# Patient Record
Sex: Male | Born: 1945
Health system: Southern US, Community
[De-identification: ages and names within clinical notes are randomized; demographics above are authoritative.]

## PROBLEM LIST (undated history)

## (undated) DIAGNOSIS — R011 Cardiac murmur, unspecified: Secondary | ICD-10-CM

## (undated) DIAGNOSIS — Z8679 Personal history of other diseases of the circulatory system: Secondary | ICD-10-CM

## (undated) DIAGNOSIS — I10 Essential (primary) hypertension: Secondary | ICD-10-CM

## (undated) DIAGNOSIS — H3530 Unspecified macular degeneration: Secondary | ICD-10-CM

## (undated) DIAGNOSIS — G4730 Sleep apnea, unspecified: Secondary | ICD-10-CM

## (undated) DIAGNOSIS — J3800 Paralysis of vocal cords and larynx, unspecified: Secondary | ICD-10-CM

## (undated) DIAGNOSIS — C801 Malignant (primary) neoplasm, unspecified: Secondary | ICD-10-CM

## (undated) HISTORY — DX: Essential (primary) hypertension: I10

## (undated) HISTORY — PX: CHOLECYSTECTOMY: SHX55

## (undated) HISTORY — PX: HEMORRHOID SURGERY: SHX153

## (undated) HISTORY — DX: Paralysis of vocal cords and larynx, unspecified: J38.00

---

## 2014-08-01 DIAGNOSIS — J3800 Paralysis of vocal cords and larynx, unspecified: Secondary | ICD-10-CM | POA: Diagnosis not present

## 2014-08-01 DIAGNOSIS — R7309 Other abnormal glucose: Secondary | ICD-10-CM | POA: Diagnosis not present

## 2014-08-01 DIAGNOSIS — H3530 Unspecified macular degeneration: Secondary | ICD-10-CM | POA: Diagnosis not present

## 2014-08-01 DIAGNOSIS — I10 Essential (primary) hypertension: Secondary | ICD-10-CM | POA: Diagnosis not present

## 2014-08-01 DIAGNOSIS — H3500 Unspecified background retinopathy: Secondary | ICD-10-CM | POA: Diagnosis not present

## 2014-08-01 DIAGNOSIS — E669 Obesity, unspecified: Secondary | ICD-10-CM | POA: Diagnosis not present

## 2014-08-01 DIAGNOSIS — Z0000 Encounter for general adult medical examination without abnormal findings: Secondary | ICD-10-CM | POA: Diagnosis not present

## 2014-08-04 ENCOUNTER — Encounter: Payer: Self-pay | Admitting: Gastroenterology

## 2014-08-26 ENCOUNTER — Ambulatory Visit: Payer: Self-pay | Admitting: Gastroenterology

## 2014-09-03 ENCOUNTER — Ambulatory Visit (INDEPENDENT_AMBULATORY_CARE_PROVIDER_SITE_OTHER): Payer: Commercial Managed Care - HMO | Admitting: Gastroenterology

## 2014-09-03 ENCOUNTER — Encounter (INDEPENDENT_AMBULATORY_CARE_PROVIDER_SITE_OTHER): Payer: Self-pay

## 2014-09-03 ENCOUNTER — Encounter: Payer: Self-pay | Admitting: Gastroenterology

## 2014-09-03 VITALS — BP 130/80 | HR 78 | Temp 98.8°F | Ht 71.0 in | Wt 238.4 lb

## 2014-09-03 DIAGNOSIS — K642 Third degree hemorrhoids: Secondary | ICD-10-CM

## 2014-09-03 MED ORDER — HYDROCORTISONE 2.5 % RE CREA: 1.0000 "application " | TOPICAL_CREAM | Freq: Two times a day (BID) | RECTAL | Status: DC

## 2014-09-03 NOTE — Assessment & Plan Note (Signed)
Rectal bleeding due to hemorrhoids. Appear to be class III. Would like to review his colonoscopy report prior to scheduling for possible [facility] hemorrhoid banding. Further recommendations to follow.    Discussed colon cancer screening options for noninvasive testing with patient given his refusal of further colonoscopies due to his wife having perforated colon from procedure. He is not interested at this time and is currently up to date. He will let us know if he changes his mind in the future.

## 2014-09-03 NOTE — Progress Notes (Signed)
Primary Care Physician:  Rosita Fire, MD  Primary Gastroenterologist:  Barney Drain, MD   Chief Complaint  Patient presents with  . Hemorrhoids    HPI:  Roger Gould is a 69 y.o. male here for further management of hemorrhoids. Patient is a new patient, sent by Dr. Legrand Rams. He recently moved to the area from [place].Last colonoscopy about 18 months ago at [facility] to have follow up in five years for prior history of colon polyps and sister deceased secondary to colon cancer at age 68.  Patient states he has had his hemorrhoids lasered several times in the past. Has been couple of years. Worried about recurrent bleeding. Denies rectal pain or irritation. When he has a BM his hemorrhoids come out. He has to push them back in and then usually notices brb on the toilet tissue. Takes about 5-10 minutes for the bleeding to stop. Uses hemorrhoid suppository after BM about three times per week. Takes metamucil to keep stools soft and regular. Denies melena, abdominal pain, UGI symptoms.   States he will NEVER have another colonoscopy. His wife had a colonic perforation related to a routine colonoscopy. Care was delayed and required extensive surgery. He is not sure if he even has screening for colon cancer by any noninvasive means.   He is interested in definitive treatment of his hemorrhoids.    Current Outpatient Prescriptions  Medication Sig Dispense Refill  . lisinopril-hydrochlorothiazide (PRINZIDE,ZESTORETIC) 20-12.5 MG per tablet Take 1 tablet by mouth daily.     No current facility-administered medications for this visit.    Allergies as of 09/03/2014  . (No Known Allergies)    Past Medical History  Diagnosis Date  . Hypertension   . Paralysis of vocal cords     congenital    Past Surgical History  Procedure Laterality Date  . Cholecystectomy    . Hemorrhoid surgery      laser couple times    Family History  Problem Relation Age of Onset  . Colon  cancer Sister     age 100, deceased  . Emphysema Father     deceased age 62    History   Social History  . Marital Status: Unknown    Spouse Name: N/A  . Number of Children: 4  . Years of Education: N/A   Occupational History  . truck driver, retired    Social History Main Topics  . Smoking status: Former Smoker    Quit date: 09/03/2003  . Smokeless tobacco: Not on file     Comment: intermittent, socially  . Alcohol Use: No     Comment: occasional only  . Drug Use: No  . Sexual Activity: Not on file   Other Topics Concern  . Not on file   Social History Narrative  . No narrative on file      ROS:  General: Negative for anorexia, weight loss, fever, chills, fatigue, weakness. Eyes: Negative for vision changes.  ENT: Negative for hoarseness, difficulty swallowing , nasal congestion. CV: Negative for chest pain, angina, palpitations, dyspnea on exertion, peripheral edema.  Respiratory: Negative for dyspnea at rest, dyspnea on exertion, cough, sputum, wheezing.  GI: See history of present illness. GU:  Negative for dysuria, hematuria, urinary incontinence, urinary frequency, nocturnal urination.  MS: Negative for joint pain, low back pain.  Derm: Negative for rash or itching.  Neuro: Negative for weakness, abnormal sensation, seizure, frequent headaches, memory loss, confusion.  Psych: Negative for anxiety, depression, suicidal ideation, hallucinations.  Endo: Negative  for unusual weight change.  Heme: Negative for bruising or bleeding. Allergy: Negative for rash or hives.    Physical Examination:  BP 130/80 mmHg  Pulse 78  Temp(Src) 98.8 F (37.1 C)  Ht 5\' 11"  (1.803 m)  Wt 238 lb 6.4 oz (108.138 kg)  BMI 33.26 kg/m2   General: Well-nourished, well-developed in no acute distress.  Head: Normocephalic, atraumatic.   Eyes: Conjunctiva pink, no icterus. Mouth: Oropharyngeal mucosa moist and pink , no lesions erythema or exudate. Neck: Supple without  thyromegaly, masses, or lymphadenopathy.  Lungs: Clear to auscultation bilaterally.  Heart: Regular rate and rhythm, no murmurs rubs or gallops.  Abdomen: Bowel sounds are normal, nontender, nondistended, no hepatosplenomegaly or masses, no abdominal bruits or    hernia , no rebound or guarding.   Rectal: multiple hemorrhoids noted externally, one is pea sized with excoriation and bleeding noted. Easily reduced with gentle pressure. Significant hemorrhoid tissue noted within anal canal. No other abnormalities noted. No rectal tenderness. Extremities: No lower extremity edema. No clubbing or deformities.  Neuro: Alert and oriented x 4 , grossly normal neurologically.  Skin: Warm and dry, no rash or jaundice.   Psych: Alert and cooperative, normal mood and affect.    Imaging Studies: No results found.

## 2014-09-03 NOTE — Progress Notes (Signed)
cc'ed to pcp °

## 2014-09-03 NOTE — Patient Instructions (Signed)
1. I will request your records from [place] for review. Once these have been received, we will provide further recommendations. Your hemorrhoids may be too large to band. 2. Regarding colon cancer screening, since you wish to avoid invasive colonoscopy, you can consider other options. Virtual colonoscopy and Cologuard DNA testing for colon cancer are noninvasive options. If you decide at any point that you are interested in one of these options, please feel free to call our office.  You should not need anything until five years from time of your last colonoscopy.

## 2014-09-09 DIAGNOSIS — H5210 Myopia, unspecified eye: Secondary | ICD-10-CM | POA: Diagnosis not present

## 2014-09-09 DIAGNOSIS — H524 Presbyopia: Secondary | ICD-10-CM | POA: Diagnosis not present

## 2014-09-25 NOTE — Progress Notes (Signed)
Dr. Oneida Alar, please look over note. He is interested in [facility] banding, question candidacy. When she like me to schedule him for possible [facility] banding and let you determine at time of exam.

## 2014-09-25 NOTE — Progress Notes (Signed)
Received records from [place]. Patient had a colonoscopy February 2014, diverticulosis, mild, in the sigmoid colon. Internal hemorrhoids. Colonoscopy recommended in 5 years. Prep was excellent. The ileocecal valve was visualized, the cecum was not intubated due to colon redundancy and insufficient length of the scope.  Flexible sigmoidoscopy in 2007, pseudomembrane in the rectum and the sigmoid colon, internal and external hemorrhoids and anal canal.

## 2014-09-29 NOTE — Progress Notes (Signed)
REVIEWED.PT HAS PROLAPSED HEMORRHOIDS AND [facility] BANDING IS NOT THE BEST OPTION FOR HIM. THE BEST DEFINITIVE TREATMENT FOR HIS HEMORRHOIDS IS HEMORRHOIDECTOMY BY A GENERAL SURGEON. IF HE DOES NOT WANT SURGERY HE COULD HAVE A FLEX SIG WITH HEMORRHOID BANDING.

## 2014-10-02 NOTE — Progress Notes (Signed)
Please inform patient that Dr. Oneida Alar as reviewed his records and does not think he would be a good candidate for [place] banding given prolapsed hemorrhoids.   Best option for him is hemorrhoidectomy by general surgery or flex sig with hemorrhoid banding.   Let me know what he decides.

## 2014-10-03 ENCOUNTER — Other Ambulatory Visit: Payer: Self-pay

## 2014-10-03 DIAGNOSIS — K649 Unspecified hemorrhoids: Secondary | ICD-10-CM

## 2014-10-03 NOTE — Progress Notes (Signed)
Referral has been made.

## 2014-10-03 NOTE — Progress Notes (Signed)
Forwarding to Clinical pool.

## 2014-10-03 NOTE — Progress Notes (Signed)
PT was informed and he said he prefers to see a Psychologist, sport and exercise. Prefers [place], but will go to [facility] if necessary.

## 2014-10-03 NOTE — Progress Notes (Signed)
Dr. Aviva Signs in [facility] Surgery either option is fine.  Make referral for hemorrhoids.

## 2014-10-24 DIAGNOSIS — J3800 Paralysis of vocal cords and larynx, unspecified: Secondary | ICD-10-CM | POA: Diagnosis not present

## 2014-10-24 DIAGNOSIS — E669 Obesity, unspecified: Secondary | ICD-10-CM | POA: Diagnosis not present

## 2014-10-24 DIAGNOSIS — Z0001 Encounter for general adult medical examination with abnormal findings: Secondary | ICD-10-CM | POA: Diagnosis not present

## 2014-10-24 DIAGNOSIS — I10 Essential (primary) hypertension: Secondary | ICD-10-CM | POA: Diagnosis not present

## 2014-12-19 ENCOUNTER — Encounter: Payer: Self-pay | Admitting: Gastroenterology

## 2015-01-08 DIAGNOSIS — K641 Second degree hemorrhoids: Secondary | ICD-10-CM | POA: Diagnosis not present

## 2015-01-09 NOTE — H&P (Signed)
  NTS SOAP Note  Vital Signs:  Vitals as of: 0/12/3816: Systolic 299: Diastolic 90: Heart Rate 89: Temp 97.41F: Height 68ft 11in: Weight 226Lbs 0 Ounces: BMI 31.52  BMI : 31.52 kg/m2  Subjective: This 69 year old male presents for of hemorrhoidal disease.  Has intermittent episodes of hemorrhoidal bleeding and irritation.  Has had limited surgery in the past.  Creams have intremittently been helpful in the past.  Has had hemorrhoidal problems for many years.  Review of Symptoms:  Constitutional:unremarkable   Head:unremarkable Eyes:unremarkable   Nose/Mouth/Throat:unremarkable Cardiovascular:  unremarkable Respiratory:unremarkable Gastrointestinal:  unremarkable   Genitourinary:unremarkable   Musculoskeletal:unremarkable Skin:unremarkable Hematolgic/Lymphatic:unremarkable   Allergic/Immunologic:unremarkable   Past Medical History:  Reviewed  Past Medical History  Surgical History: cholecystectomy, hemorrhoidectomy Medical Problems: HTN Allergies: nkda Medications: prinzide   Social History:Reviewed  Social History  Preferred Language: English Race:  White Ethnicity: Not Hispanic / Latino Age: 69 year Marital Status:  M Alcohol: 2-4 beers a day   Smoking Status: Never smoker reviewed on 01/08/2015 Functional Status reviewed on 01/08/2015 ------------------------------------------------ Bathing: Normal Cooking: Normal Dressing: Normal Driving: Normal Eating: Normal Managing Meds: Normal Oral Care: Normal Shopping: Normal Toileting: Normal Transferring: Normal Walking: Normal Cognitive Status reviewed on 01/08/2015 ------------------------------------------------ Attention: Normal Decision Making: Normal Language: Normal Memory: Normal Motor: Normal Perception: Normal Problem Solving: Normal Visual and Spatial: Normal   Family History:Reviewed  Family Health History Family History is Unknown    Objective  Information: General:Well appearing, well nourished in no distress. Heart:RRR, no murmur Lungs:  CTA bilaterally, no wheezes, rhonchi, rales.  Breathing unlabored. prolapsing internal hemorrhoid with external hemorrhoid noted along right lateral aspect, smaller internal hemorrhoid noted along left lateral aspect  Assessment:Internal/external hemorrhoidal disease  Diagnoses: 455.6  K64.1 Hemorrhoids (Second degree hemorrhoids)  Procedures: 37169 - OFFICE OUTPATIENT NEW 30 MINUTES    Plan:  Scheduled for extensive hemorrhoidectomy on 01/19/15.   Patient Education:Alternative treatments to surgery were discussed with patient (and family).  Risks and benefits  of procedure incluidng bleeding, infection, and recurrence of the hemorrhoids were fully explained to the patient (and family) who gave informed consent. Patient/family questions were addressed.  Follow-up:Pending Surgery

## 2015-01-12 NOTE — Patient Instructions (Signed)
Roger Gould  01/12/2015     @PREFPERIOPPHARMACY @   Your procedure is scheduled on  01/19/2015   Report to [facility] at  720  A.M.  Call this number if you have problems the morning of surgery:  [phone]   Remember:  Do not eat food or drink liquids after midnight.  Take these medicines the morning of surgery with A SIP OF WATER  lisinopril   Do not wear jewelry, make-up or nail polish.  Do not wear lotions, powders, or perfumes.  You may wear deodorant.  Do not shave 48 hours prior to surgery.  Men may shave face and neck.  Do not bring valuables to the hospital.  [facility] is not responsible for any belongings or valuables.  Contacts, dentures or bridgework may not be worn into surgery.  Leave your suitcase in the car.  After surgery it may be brought to your room.  For patients admitted to the hospital, discharge time will be determined by your treatment team.  Patients discharged the day of surgery will not be allowed to drive home.   Name and phone number of your driver:   family Special instructions:  none  Please read over the following fact sheets that you were given. Pain Booklet, Coughing and Deep Breathing, Surgical Site Infection Prevention, Anesthesia Post-op Instructions and Care and Recovery After Surgery      Hemorrhoidectomy Hemorrhoidectomy is surgery to remove hemorrhoids. Hemorrhoids are veins that have become swollen in the rectum. The rectum is the area from the bottom end of the intestines to the opening where bowel movements leave the body. Hemorrhoids can be uncomfortable. They can cause itching, bleeding and pain if a blood clot forms in them (thrombose). If hemorrhoids are small, surgery may not be needed. But if they cover a larger area, surgery is usually suggested.  LET YOUR CAREGIVER KNOW ABOUT:   Any allergies.  All medications you are taking, including:  Herbs, eyedrops, over-the-counter medications and creams.  Blood  thinners (anticoagulants), aspirin or other drugs that could affect blood clotting.  Use of steroids (by mouth or as creams).  Previous problems with anesthetics, including local anesthetics.  Possibility of pregnancy, if this applies.  Any history of blood clots.  Any history of bleeding or other blood problems.  Previous surgery.  Smoking history.  Other health problems. RISKS AND COMPLICATIONS All surgery carries some risk. However, hemorrhoid surgery usually goes smoothly. Possible complications could include:  Urinary retention.  Bleeding.  Infection.  A painful incision.  A reaction to the anesthesia (this is not common). BEFORE THE PROCEDURE   Stop using aspirin and non-steroidal anti-inflammatory drugs (NSAIDs) for pain relief. This includes prescription drugs and over-the-counter drugs such as ibuprofen and naproxen. Also stop taking vitamin E. If possible, do this two weeks before your surgery.  If you take blood-thinners, ask your healthcare provider when you should stop taking them.  You will probably have blood and urine tests done several days before your surgery.  Do not eat or drink for about 8 hours before the surgery.  Arrive at least an hour before the surgery, or whenever your surgeon recommends. This will give you time to check in and fill out any needed paperwork.  Hemorrhoidectomy is often an outpatient procedure. This means you will be able to go home the same day. Sometimes, though, people stay overnight in the hospital after the procedure. Ask your surgeon what to expect. Either way, make  arrangements in advance for someone to drive you home. PROCEDURE   The preparation:  You will change into a hospital gown.  You will be given an IV. A needle will be inserted in your arm. Medication can flow directly into your body through this needle.  You might be given an enema to clear your rectum.  Once in the operating room, you will probably lie on  your side or be repositioned later to lying on your stomach.  You will be given anesthesia (medication) so you will not feel anything during the surgery. The surgery often is done with local anesthesia (the area near the hemorrhoids will be numb and you will be drowsy but awake). Sometimes, general anesthesia is used (you will be asleep during the procedure).  The procedure:  There are a few different procedures for hemorrhoids. Be sure to ask you surgeon about the procedure, the risks and benefits.  Be sure to ask about what you need to do to take care of the wound, if there is one. AFTER THE PROCEDURE  You will stay in a recovery area until the anesthesia has worn off. Your blood pressure and pulse will be checked every so often.  You may feel a lot of pain in the area of the rectum.  Take all pain medication prescribed by your surgeon. Ask before taking any over-the-counter pain medicines.  Sometimes sitting in a warm bath can help relieve your pain.  To make sure you have bowel movements without straining:  You will probably need to take stool softeners (usually a pill) for a few days.  You should drink 8 to 10 glasses of water each day.  Your activity will be restricted for awhile. Ask your caregiver for a list of what you should and should not do while you recover. Document Released: 01/23/2009 Document Revised: 06/20/2011 Document Reviewed: 01/23/2009 [facility] Patient Information 2015 [place], [place]. This information is not intended to replace advice given to you by your health care provider. Make sure you discuss any questions you have with your health care provider. PATIENT INSTRUCTIONS POST-ANESTHESIA  IMMEDIATELY FOLLOWING SURGERY:  Do not drive or operate machinery for the first twenty four hours after surgery.  Do not make any important decisions for twenty four hours after surgery or while taking narcotic pain medications or sedatives.  If you develop intractable nausea  and vomiting or a severe headache please notify your doctor immediately.  FOLLOW-UP:  Please make an appointment with your surgeon as instructed. You do not need to follow up with anesthesia unless specifically instructed to do so.  WOUND CARE INSTRUCTIONS (if applicable):  Keep a dry clean dressing on the anesthesia/puncture wound site if there is drainage.  Once the wound has quit draining you may leave it open to air.  Generally you should leave the bandage intact for twenty four hours unless there is drainage.  If the epidural site drains for more than 36-48 hours please call the anesthesia department.  QUESTIONS?:  Please feel free to call your physician or the hospital operator if you have any questions, and they will be happy to assist you.

## 2015-01-13 ENCOUNTER — Other Ambulatory Visit: Payer: Self-pay

## 2015-01-13 ENCOUNTER — Encounter (HOSPITAL_COMMUNITY)
Admission: RE | Admit: 2015-01-13 | Discharge: 2015-01-13 | Disposition: A | Discharge status: Home / Self Care | Payer: Commercial Managed Care - HMO | Source: Ambulatory Visit | Attending: General Surgery | Admitting: General Surgery

## 2015-01-13 ENCOUNTER — Encounter (HOSPITAL_COMMUNITY): Payer: Self-pay

## 2015-01-13 DIAGNOSIS — K644 Residual hemorrhoidal skin tags: Secondary | ICD-10-CM | POA: Diagnosis not present

## 2015-01-13 DIAGNOSIS — H2512 Age-related nuclear cataract, left eye: Secondary | ICD-10-CM | POA: Diagnosis not present

## 2015-01-13 DIAGNOSIS — H2511 Age-related nuclear cataract, right eye: Secondary | ICD-10-CM | POA: Diagnosis not present

## 2015-01-13 DIAGNOSIS — K648 Other hemorrhoids: Secondary | ICD-10-CM | POA: Insufficient documentation

## 2015-01-13 DIAGNOSIS — Z01818 Encounter for other preprocedural examination: Secondary | ICD-10-CM | POA: Insufficient documentation

## 2015-01-13 DIAGNOSIS — H353131 Nonexudative age-related macular degeneration, bilateral, early dry stage: Secondary | ICD-10-CM | POA: Diagnosis not present

## 2015-01-13 HISTORY — DX: Personal history of other diseases of the circulatory system: Z86.79

## 2015-01-13 HISTORY — DX: Unspecified macular degeneration: H35.30

## 2015-01-13 HISTORY — DX: Malignant (primary) neoplasm, unspecified: C80.1

## 2015-01-13 HISTORY — DX: Sleep apnea, unspecified: G47.30

## 2015-01-13 LAB — CBC WITH DIFFERENTIAL/PLATELET
BASOS ABS: 0.1 10*3/uL (ref 0.0–0.1)
BASOS PCT: 1 %
EOS ABS: 0.2 10*3/uL (ref 0.0–0.7)
Eosinophils Relative: 2 %
HEMATOCRIT: 43.1 % (ref 39.0–52.0)
HEMOGLOBIN: 15.3 g/dL (ref 13.0–17.0)
Lymphocytes Relative: 30 %
Lymphs Abs: 2.5 10*3/uL (ref 0.7–4.0)
MCH: 32.1 pg (ref 26.0–34.0)
MCHC: 35.5 g/dL (ref 30.0–36.0)
MCV: 90.4 fL (ref 78.0–100.0)
Monocytes Absolute: 0.7 10*3/uL (ref 0.1–1.0)
Monocytes Relative: 8 %
NEUTROS ABS: 4.9 10*3/uL (ref 1.7–7.7)
NEUTROS PCT: 59 %
Platelets: 209 10*3/uL (ref 150–400)
RBC: 4.77 MIL/uL (ref 4.22–5.81)
RDW: 12.8 % (ref 11.5–15.5)
WBC: 8.3 10*3/uL (ref 4.0–10.5)

## 2015-01-13 LAB — BASIC METABOLIC PANEL
ANION GAP: 9 (ref 5–15)
BUN: 23 mg/dL — ABNORMAL HIGH (ref 6–20)
CALCIUM: 9 mg/dL (ref 8.9–10.3)
CO2: 26 mmol/L (ref 22–32)
CREATININE: 1.12 mg/dL (ref 0.61–1.24)
Chloride: 104 mmol/L (ref 101–111)
Glucose, Bld: 93 mg/dL (ref 65–99)
Potassium: 3.9 mmol/L (ref 3.5–5.1)
SODIUM: 139 mmol/L (ref 135–145)

## 2015-01-13 NOTE — Pre-Procedure Instructions (Signed)
Patient given inforfmation to sign up for my chart at home.

## 2015-01-14 ENCOUNTER — Ambulatory Visit: Payer: Commercial Managed Care - HMO | Admitting: Gastroenterology

## 2015-01-19 ENCOUNTER — Encounter (HOSPITAL_COMMUNITY)
Admission: RE | Disposition: A | Discharge status: Home / Self Care | Payer: Self-pay | Source: Ambulatory Visit | Attending: General Surgery

## 2015-01-19 ENCOUNTER — Ambulatory Visit (HOSPITAL_COMMUNITY): Payer: Commercial Managed Care - HMO | Admitting: Anesthesiology

## 2015-01-19 ENCOUNTER — Ambulatory Visit (HOSPITAL_COMMUNITY)
Admission: RE | Admit: 2015-01-19 | Discharge: 2015-01-19 | Disposition: A | Discharge status: Home / Self Care | Payer: Commercial Managed Care - HMO | Source: Ambulatory Visit | Attending: General Surgery | Admitting: General Surgery

## 2015-01-19 DIAGNOSIS — G4730 Sleep apnea, unspecified: Secondary | ICD-10-CM | POA: Insufficient documentation

## 2015-01-19 DIAGNOSIS — I10 Essential (primary) hypertension: Secondary | ICD-10-CM | POA: Diagnosis not present

## 2015-01-19 DIAGNOSIS — Z87891 Personal history of nicotine dependence: Secondary | ICD-10-CM | POA: Insufficient documentation

## 2015-01-19 DIAGNOSIS — K648 Other hemorrhoids: Secondary | ICD-10-CM | POA: Diagnosis not present

## 2015-01-19 DIAGNOSIS — K644 Residual hemorrhoidal skin tags: Secondary | ICD-10-CM | POA: Insufficient documentation

## 2015-01-19 DIAGNOSIS — K649 Unspecified hemorrhoids: Secondary | ICD-10-CM | POA: Diagnosis present

## 2015-01-19 DIAGNOSIS — K641 Second degree hemorrhoids: Secondary | ICD-10-CM | POA: Diagnosis not present

## 2015-01-19 HISTORY — PX: HEMORRHOID SURGERY: SHX153

## 2015-01-19 SURGERY — HEMORRHOIDECTOMY
Anesthesia: General | Site: Rectum

## 2015-01-19 MED ORDER — KETOROLAC TROMETHAMINE 30 MG/ML IJ SOLN
30.0000 mg | Freq: Once | INTRAMUSCULAR | Status: AC
  Administered 2015-01-19: 30 mg via INTRAVENOUS

## 2015-01-19 MED ORDER — FENTANYL CITRATE (PF) 100 MCG/2ML IJ SOLN: 25.0000 ug | INTRAMUSCULAR | Status: DC | PRN

## 2015-01-19 MED ORDER — LACTATED RINGERS IV SOLN
INTRAVENOUS | Status: DC
  Administered 2015-01-19: 09:00:00 via INTRAVENOUS

## 2015-01-19 MED ORDER — HYDROCODONE-ACETAMINOPHEN 5-325 MG PO TABS: 1.0000 | ORAL_TABLET | Freq: Four times a day (QID) | ORAL | Status: AC | PRN

## 2015-01-19 MED ORDER — MIDAZOLAM HCL 2 MG/2ML IJ SOLN
1.0000 mg | INTRAMUSCULAR | Status: DC | PRN
Start: 2015-01-19 — End: 2015-01-19
  Administered 2015-01-19: 2 mg via INTRAVENOUS

## 2015-01-19 MED ORDER — SODIUM CHLORIDE 0.9 % IR SOLN
Status: DC | PRN
Start: 2015-01-19 — End: 2015-01-19
  Administered 2015-01-19: 500 mL

## 2015-01-19 MED ORDER — BUPIVACAINE HCL (PF) 0.5 % IJ SOLN
INTRAMUSCULAR | Status: AC
  Filled 2015-01-19: qty 30

## 2015-01-19 MED ORDER — PROPOFOL 10 MG/ML IV BOLUS
INTRAVENOUS | Status: AC
  Filled 2015-01-19: qty 20

## 2015-01-19 MED ORDER — FENTANYL CITRATE (PF) 100 MCG/2ML IJ SOLN
INTRAMUSCULAR | Status: DC | PRN
  Administered 2015-01-19 (×2): 50 ug via INTRAVENOUS

## 2015-01-19 MED ORDER — CHLORHEXIDINE GLUCONATE 4 % EX LIQD: 1.0000 "application " | Freq: Once | CUTANEOUS | Status: DC

## 2015-01-19 MED ORDER — BUPIVACAINE HCL (PF) 0.5 % IJ SOLN
INTRAMUSCULAR | Status: DC | PRN
  Administered 2015-01-19: 9 mL

## 2015-01-19 MED ORDER — FENTANYL CITRATE (PF) 100 MCG/2ML IJ SOLN
25.0000 ug | INTRAMUSCULAR | Status: AC
  Administered 2015-01-19: 25 ug via INTRAVENOUS

## 2015-01-19 MED ORDER — ONDANSETRON HCL 4 MG/2ML IJ SOLN
4.0000 mg | Freq: Once | INTRAMUSCULAR | Status: AC
  Administered 2015-01-19: 4 mg via INTRAVENOUS

## 2015-01-19 MED ORDER — PROPOFOL 10 MG/ML IV BOLUS
INTRAVENOUS | Status: DC | PRN
Start: 2015-01-19 — End: 2015-01-19
  Administered 2015-01-19: 20 mg via INTRAVENOUS
  Administered 2015-01-19: 150 mg via INTRAVENOUS
  Administered 2015-01-19: 30 mg via INTRAVENOUS

## 2015-01-19 MED ORDER — LIDOCAINE VISCOUS 2 % MT SOLN
OROMUCOSAL | Status: AC
  Filled 2015-01-19: qty 15

## 2015-01-19 MED ORDER — METRONIDAZOLE IN NACL 5-0.79 MG/ML-% IV SOLN
500.0000 mg | INTRAVENOUS | Status: AC
  Administered 2015-01-19: 500 mg via INTRAVENOUS

## 2015-01-19 MED ORDER — KETOROLAC TROMETHAMINE 30 MG/ML IJ SOLN
INTRAMUSCULAR | Status: AC
  Filled 2015-01-19: qty 1

## 2015-01-19 MED ORDER — LIDOCAINE HCL (CARDIAC) 10 MG/ML IV SOLN
INTRAVENOUS | Status: DC | PRN
  Administered 2015-01-19: 50 mg via INTRAVENOUS

## 2015-01-19 MED ORDER — MIDAZOLAM HCL 2 MG/2ML IJ SOLN
INTRAMUSCULAR | Status: AC
  Filled 2015-01-19: qty 2

## 2015-01-19 MED ORDER — ONDANSETRON HCL 4 MG/2ML IJ SOLN
INTRAMUSCULAR | Status: AC
  Filled 2015-01-19: qty 2

## 2015-01-19 MED ORDER — METRONIDAZOLE IN NACL 5-0.79 MG/ML-% IV SOLN
INTRAVENOUS | Status: AC
  Filled 2015-01-19: qty 100

## 2015-01-19 MED ORDER — FENTANYL CITRATE (PF) 100 MCG/2ML IJ SOLN
INTRAMUSCULAR | Status: AC
  Filled 2015-01-19: qty 2

## 2015-01-19 MED ORDER — LIDOCAINE VISCOUS 2 % MT SOLN
OROMUCOSAL | Status: DC | PRN
  Administered 2015-01-19: 1 via OROMUCOSAL

## 2015-01-19 MED ORDER — ONDANSETRON HCL 4 MG/2ML IJ SOLN: 4.0000 mg | Freq: Once | INTRAMUSCULAR | Status: DC | PRN

## 2015-01-19 MED ORDER — FENTANYL CITRATE (PF) 100 MCG/2ML IJ SOLN
INTRAMUSCULAR | Status: AC
  Filled 2015-01-19: qty 4

## 2015-01-19 SURGICAL SUPPLY — 28 items
BAG HAMPER (MISCELLANEOUS) ×3 IMPLANT
CLOTH BEACON ORANGE TIMEOUT ST (SAFETY) ×3 IMPLANT
COVER LIGHT HANDLE STERIS (MISCELLANEOUS) ×6 IMPLANT
COVER MAYO STAND XLG (DRAPE) ×3 IMPLANT
DECANTER SPIKE VIAL GLASS SM (MISCELLANEOUS) ×3 IMPLANT
DRAPE PROXIMA HALF (DRAPES) ×3 IMPLANT
ELECT REM PT RETURN 9FT ADLT (ELECTROSURGICAL) ×3
ELECTRODE REM PT RTRN 9FT ADLT (ELECTROSURGICAL) ×1 IMPLANT
FORMALIN 10 PREFIL 120ML (MISCELLANEOUS) ×3 IMPLANT
GAUZE SPONGE 4X4 12PLY STRL (GAUZE/BANDAGES/DRESSINGS) ×3 IMPLANT
GLOVE SURG SS PI 7.5 STRL IVOR (GLOVE) ×6 IMPLANT
GOWN STRL REUS W/ TWL XL LVL3 (GOWN DISPOSABLE) ×1 IMPLANT
GOWN STRL REUS W/TWL LRG LVL3 (GOWN DISPOSABLE) ×3 IMPLANT
GOWN STRL REUS W/TWL XL LVL3 (GOWN DISPOSABLE) ×2
HEMOSTAT SURGICEL 4X8 (HEMOSTASIS) ×3 IMPLANT
KIT ROOM TURNOVER AP CYSTO (KITS) ×3 IMPLANT
LIGASURE IMPACT 36 18CM CVD LR (INSTRUMENTS) ×3 IMPLANT
MANIFOLD NEPTUNE II (INSTRUMENTS) ×3 IMPLANT
NEEDLE HYPO 25X1 1.5 SAFETY (NEEDLE) ×3 IMPLANT
NS IRRIG 1000ML POUR BTL (IV SOLUTION) ×3 IMPLANT
PACK PERI GYN (CUSTOM PROCEDURE TRAY) ×3 IMPLANT
PAD ARMBOARD 7.5X6 YLW CONV (MISCELLANEOUS) ×3 IMPLANT
SET BASIN LINEN APH (SET/KITS/TRAYS/PACK) ×3 IMPLANT
SPONGE GAUZE 4X4 12PLY (GAUZE/BANDAGES/DRESSINGS) ×2 IMPLANT
SURGILUBE 3G PEEL PACK STRL (MISCELLANEOUS) ×3 IMPLANT
SUT SILK 0 FSL (SUTURE) ×3 IMPLANT
SUT VIC AB 2-0 CT2 27 (SUTURE) IMPLANT
SYRINGE 10CC LL (SYRINGE) ×3 IMPLANT

## 2015-01-19 NOTE — Transfer of Care (Signed)
Immediate Anesthesia Transfer of Care Note  Patient: Roger Gould  Procedure(s) Performed: Procedure(s): EXTENSIVE HEMORRHOIDECTOMY (N/A)  Patient Location: PACU  Anesthesia Type:General  Level of Consciousness: sedated and patient cooperative  Airway & Oxygen Therapy: Patient Spontanous Breathing and non-rebreather face mask  Post-op Assessment: Report given to RN, Post -op Vital signs reviewed and stable and Patient moving all extremities  Post vital signs: Reviewed and stable    Complications: No apparent anesthesia complications

## 2015-01-19 NOTE — Interval H&P Note (Signed)
History and Physical Interval Note:  01/19/2015 8:29 AM  Roger Gould  has presented today for surgery, with the diagnosis of internal and external hemorrhoids  The various methods of treatment have been discussed with the patient and family. After consideration of risks, benefits and other options for treatment, the patient has consented to  Procedure(s): EXTENSIVE HEMORRHOIDECTOMY (N/A) as a surgical intervention .  The patient's history has been reviewed, patient examined, no change in status, stable for surgery.  I have reviewed the patient's chart and labs.  Questions were answered to the patient's satisfaction.     Aviva Signs A

## 2015-01-19 NOTE — Op Note (Signed)
Patient:  Roger Gould  DOB:  1945/06/04  MRN:  [id number]   Preop Diagnosis:  Bleeding hemorrhoidal disease  Postop Diagnosis:  Same, prolapse  Procedure:  Extensive hemorrhoidectomy  Surgeon:  Aviva Signs, M.D.  Anes:  Gen.  Indications:  Patient is a 69 year old white male who presents with bleeding hemorrhoidal disease. The risks and benefits of the procedure including bleeding, infection, and the possibility of recurrence of hemorrhoidal disease were fully explained to the patient, who gave informed consent.  Procedure note:  The patient was placed in the lithotomy position after general anesthesia was administered. The perineum was prepped and draped using the usual sterile technique with Betadine. Surgical site confirmation was performed.  On rectal examination, the patient had internal and external hemorrhoidal disease at the 7:00 position along with prolapsing of the internal hemorrhoid and mucosa. The external and internal hemorrhoids were removed in continuity in this region using the LigaSure in a Mar-Mac like manner. Care was taken to avoid the external sphincter mechanism. The mucosa was also reaproximated with interrupted 2-0 Vicryl sutures. No significant other hemorrhoids were found. 0.5% Sensorcaine was instilled the surrounding perineum. Surgicel and Viscous Xylocaine rectal packing was then placed.  All tape and needle counts were correct at the end of the procedure. The patient was awakened and transferred to PACU in stable condition.  Complications:  None  EBL:  Minimal  Specimen:  Hemorrhoids

## 2015-01-19 NOTE — Discharge Instructions (Signed)
Surgical Procedures for Hemorrhoids, Care After Refer to this sheet in the next few weeks. These instructions provide you with information about caring for yourself after your procedure. Your health care provider may also give you more specific instructions. Your treatment has been planned according to current medical practices, but problems sometimes occur. Call your health care provider if you have any problems or questions after your procedure. WHAT TO EXPECT AFTER THE PROCEDURE After your procedure, it is common to have:  Rectal pain.  Pain when you are having a bowel movement.  Slight rectal bleeding. HOME CARE INSTRUCTIONS Medicines  Take over-the-counter and prescription medicines only as told by your health care provider.  Do not drive or operate heavy machinery while taking prescription pain medicine.  Use a stool softener or a bulk laxative as told by your health care provider. Activities  Rest at home. Return to your normal activities as told by your health care provider.  Do not lift anything that is heavier than 10 lb (4.5 kg).  Do not sit for long periods of time. Take a walk every day or as told by your health care provider.  Do not strain to have a bowel movement. Do not spend a long time sitting on the toilet. Eating and Drinking  Eat foods that contain fiber, such as whole grains, beans, nuts, fruits, and vegetables.  Drink enough fluid to keep your urine clear or pale yellow. General Instructions  Sit in a warm bath 2-3 times per day to relieve soreness or itching.  Keep all follow-up visits as told by your health care provider. This is important. SEEK MEDICAL CARE IF:  Your pain medicine is not helping.  You have a fever or chills.  You become constipated.  You have trouble passing urine. SEEK IMMEDIATE MEDICAL CARE IF:  You have very bad rectal pain.  You have heavy bleeding from your rectum.   This information is not intended to replace advice  given to you by your health care provider. Make sure you discuss any questions you have with your health care provider.   Document Released: 06/18/2003 Document Revised: 12/17/2014 Document Reviewed: 06/23/2014 Elsevier Interactive Patient Education 2016 [employer]  PATIENT INSTRUCTIONS POST-ANESTHESIA  IMMEDIATELY FOLLOWING SURGERY:  Do not drive or operate machinery for the first twenty four hours after surgery.  Do not make any important decisions for twenty four hours after surgery or while taking narcotic pain medications or sedatives.  If you develop intractable nausea and vomiting or a severe headache please notify your doctor immediately.  FOLLOW-UP:  Please make an appointment with your surgeon as instructed. You do not need to follow up with anesthesia unless specifically instructed to do so.  WOUND CARE INSTRUCTIONS (if applicable):  Keep a dry clean dressing on the anesthesia/puncture wound site if there is drainage.  Once the wound has quit draining you may leave it open to air.  Generally you should leave the bandage intact for twenty four hours unless there is drainage.  If the epidural site drains for more than 36-48 hours please call the anesthesia department.  QUESTIONS?:  Please feel free to call your physician or the hospital operator if you have any questions, and they will be happy to assist you.

## 2015-01-19 NOTE — Anesthesia Postprocedure Evaluation (Signed)
  Anesthesia Post-op Note Late entry:Patient: Roger Gould  Procedure(s) Performed: Procedure(s): EXTENSIVE HEMORRHOIDECTOMY (N/A)  Patient Location: PACU  Anesthesia Type:General  Level of Consciousness: awake, alert , oriented and patient cooperative  Airway and Oxygen Therapy: Patient Spontanous Breathing  Post-op Pain: mild  Post-op Assessment: Post-op Vital signs reviewed, Patient's Cardiovascular Status Stable, Respiratory Function Stable, Patent Airway, No signs of Nausea or vomiting and Pain level controlled              Post-op Vital Signs: Reviewed and stable  Last Vitals:  Filed Vitals:   01/19/15 1044  BP: 123/71  Pulse: 58  Temp:   Resp: 16    Complications: No apparent anesthesia complications

## 2015-01-19 NOTE — Anesthesia Preprocedure Evaluation (Signed)
Anesthesia Evaluation  Patient identified by MRN, date of birth, ID band Patient awake    Reviewed: Allergy & Precautions, NPO status , Patient's Chart, lab work & pertinent test results  Airway Mallampati: I  TM Distance: >3 FB    Comment: Raspy voice from paralysed VC Dental  (+) Edentulous Upper, Edentulous Lower   Pulmonary sleep apnea and Continuous Positive Airway Pressure Ventilation , former smoker,    breath sounds clear to auscultation       Cardiovascular hypertension, Pt. on medications  Rhythm:Regular Rate:Normal     Neuro/Psych    GI/Hepatic negative GI ROS,   Endo/Other    Renal/GU      Musculoskeletal   Abdominal   Peds  Hematology   Anesthesia Other Findings   Reproductive/Obstetrics                             Anesthesia Physical Anesthesia Plan  ASA: II  Anesthesia Plan: General   Post-op Pain Management:    Induction: Intravenous  Airway Management Planned: LMA  Additional Equipment:   Intra-op Plan:   Post-operative Plan: Extubation in OR  Informed Consent: I have reviewed the patients History and Physical, chart, labs and discussed the procedure including the risks, benefits and alternatives for the proposed anesthesia with the patient or authorized representative who has indicated his/her understanding and acceptance.     Plan Discussed with:   Anesthesia Plan Comments:         Anesthesia Quick Evaluation

## 2015-01-19 NOTE — Anesthesia Procedure Notes (Signed)
Procedure Name: LMA Insertion Date/Time: 01/19/2015 9:14 AM Performed by: Vista Deck Pre-anesthesia Checklist: Patient identified, Patient being monitored, Emergency Drugs available, Timeout performed and Suction available Patient Re-evaluated:Patient Re-evaluated prior to inductionOxygen Delivery Method: Circle System Utilized Preoxygenation: Pre-oxygenation with 100% oxygen Intubation Type: IV induction Ventilation: Mask ventilation without difficulty LMA: LMA inserted LMA Size: 4.0 Number of attempts: 1 Placement Confirmation: positive ETCO2 and breath sounds checked- equal and bilateral Tube secured with: Tape

## 2015-01-20 ENCOUNTER — Encounter (HOSPITAL_COMMUNITY): Payer: Self-pay | Admitting: General Surgery

## 2015-01-23 DIAGNOSIS — J3800 Paralysis of vocal cords and larynx, unspecified: Secondary | ICD-10-CM | POA: Diagnosis not present

## 2015-01-23 DIAGNOSIS — I10 Essential (primary) hypertension: Secondary | ICD-10-CM | POA: Diagnosis not present

## 2015-01-23 DIAGNOSIS — E669 Obesity, unspecified: Secondary | ICD-10-CM | POA: Diagnosis not present

## 2015-01-23 DIAGNOSIS — Z23 Encounter for immunization: Secondary | ICD-10-CM | POA: Diagnosis not present

## 2015-01-23 DIAGNOSIS — K649 Unspecified hemorrhoids: Secondary | ICD-10-CM | POA: Diagnosis not present

## 2015-02-16 DIAGNOSIS — L570 Actinic keratosis: Secondary | ICD-10-CM | POA: Diagnosis not present

## 2015-02-16 DIAGNOSIS — D239 Other benign neoplasm of skin, unspecified: Secondary | ICD-10-CM | POA: Diagnosis not present

## 2015-02-24 ENCOUNTER — Encounter (HOSPITAL_COMMUNITY): Payer: Self-pay | Admitting: Emergency Medicine

## 2015-02-24 ENCOUNTER — Observation Stay (HOSPITAL_COMMUNITY)
Admission: EM | Admit: 2015-02-24 | Discharge: 2015-02-25 | Disposition: A | Discharge status: Home / Self Care | Payer: Commercial Managed Care - HMO | Attending: Internal Medicine | Admitting: Internal Medicine

## 2015-02-24 ENCOUNTER — Emergency Department (HOSPITAL_COMMUNITY): Payer: Commercial Managed Care - HMO

## 2015-02-24 DIAGNOSIS — R072 Precordial pain: Secondary | ICD-10-CM | POA: Diagnosis not present

## 2015-02-24 DIAGNOSIS — J3800 Paralysis of vocal cords and larynx, unspecified: Secondary | ICD-10-CM | POA: Diagnosis not present

## 2015-02-24 DIAGNOSIS — G4730 Sleep apnea, unspecified: Secondary | ICD-10-CM | POA: Diagnosis not present

## 2015-02-24 DIAGNOSIS — Z85828 Personal history of other malignant neoplasm of skin: Secondary | ICD-10-CM | POA: Insufficient documentation

## 2015-02-24 DIAGNOSIS — Z79899 Other long term (current) drug therapy: Secondary | ICD-10-CM | POA: Insufficient documentation

## 2015-02-24 DIAGNOSIS — R42 Dizziness and giddiness: Secondary | ICD-10-CM | POA: Diagnosis not present

## 2015-02-24 DIAGNOSIS — Z87891 Personal history of nicotine dependence: Secondary | ICD-10-CM | POA: Insufficient documentation

## 2015-02-24 DIAGNOSIS — R0789 Other chest pain: Secondary | ICD-10-CM | POA: Diagnosis not present

## 2015-02-24 DIAGNOSIS — R079 Chest pain, unspecified: Principal | ICD-10-CM | POA: Diagnosis present

## 2015-02-24 DIAGNOSIS — I10 Essential (primary) hypertension: Secondary | ICD-10-CM | POA: Diagnosis not present

## 2015-02-24 DIAGNOSIS — H3530 Unspecified macular degeneration: Secondary | ICD-10-CM | POA: Diagnosis not present

## 2015-02-24 LAB — CBC
HCT: 42.2 % (ref 39.0–52.0)
HEMOGLOBIN: 14.6 g/dL (ref 13.0–17.0)
MCH: 30.9 pg (ref 26.0–34.0)
MCHC: 34.6 g/dL (ref 30.0–36.0)
MCV: 89.4 fL (ref 78.0–100.0)
PLATELETS: 180 10*3/uL (ref 150–400)
RBC: 4.72 MIL/uL (ref 4.22–5.81)
RDW: 12.9 % (ref 11.5–15.5)
WBC: 9.2 10*3/uL (ref 4.0–10.5)

## 2015-02-24 LAB — BASIC METABOLIC PANEL
ANION GAP: 10 (ref 5–15)
BUN: 27 mg/dL — ABNORMAL HIGH (ref 6–20)
CALCIUM: 9.1 mg/dL (ref 8.9–10.3)
CO2: 23 mmol/L (ref 22–32)
CREATININE: 0.99 mg/dL (ref 0.61–1.24)
Chloride: 106 mmol/L (ref 101–111)
Glucose, Bld: 129 mg/dL — ABNORMAL HIGH (ref 65–99)
Potassium: 3.4 mmol/L — ABNORMAL LOW (ref 3.5–5.1)
SODIUM: 139 mmol/L (ref 135–145)

## 2015-02-24 LAB — TROPONIN I

## 2015-02-24 NOTE — ED Notes (Addendum)
Pt c/o sudden onset of chest pain with diaphoresis. Pt denies chest pain at this time. Pt took 4 baby aspirin at home before ems arrived.

## 2015-02-24 NOTE — H&P (Signed)
[facility] History and Physical  Hallet Obenour [id number] DOB: 1945/07/05    PCP:   Rosita Fire, MD   Chief Complaint:  Retrosternal chest pressure, lightheadedness and diaphoresis for 15 minutes.   HPI: Roger Gould is an 69 y.o. male with hx of HTN, Vocal chord paralysis, sleep apnea but no longer use CPAP after weightloss, with no known hx of CAD or prior MI, presented to the ER with about 15 minutes of retrosternal chest pressure, some lighheadedness, and diaphoresis.  He was at rest when it happened.  He is otherwise feeling well, and he did not have any exertional chest pain or chest pressure with exertion. There is no pleuritic pain, fever, chills, or coughs.  Evaluation in the ER included an EKG which showed Q waves in III and AVF, no acute ST segment changes.  His CXR was clear and his troponin was negative.  When he was in the ED, he denied any symptoms.  Hospitalist was asked to admit him for cardiac r/out.   Rewiew of Systems:  Constitutional: Negative for malaise, fever and chills. No significant weight loss or weight gain Eyes: Negative for eye pain, redness and discharge, diplopia, visual changes, or flashes of light. ENMT: Negative for ear pain, hoarseness, nasal congestion, sinus pressure and sore throat. No headaches; tinnitus, drooling, or problem swallowing. Cardiovascular: Negative for dyspnea and peripheral edema. ; No orthopnea, PND Respiratory: Negative for cough, hemoptysis, wheezing and stridor. No pleuritic chestpain. Gastrointestinal: Negative for nausea, vomiting, diarrhea, constipation, abdominal pain, melena, blood in stool, hematemesis, jaundice and rectal bleeding.    Genitourinary: Negative for frequency, dysuria, incontinence,flank pain and hematuria; Musculoskeletal: Negative for back pain and neck pain. Negative for swelling and trauma.;  Skin: . Negative for pruritus, rash, abrasions, bruising and skin lesion.; ulcerations Neuro: Negative  for headache, lightheadedness and neck stiffness. Negative for weakness, altered level of consciousness , altered mental status, extremity weakness, burning feet, involuntary movement, seizure and syncope.  Psych: negative for anxiety, depression, insomnia, tearfulness, panic attacks, hallucinations, paranoia, suicidal or homicidal ideation    Past Medical History  Diagnosis Date  . Hypertension   . Paralysis of vocal cords     congenital  . Sleep apnea     has CPAP but doesnt use it. Pt has lost weight and does not need.  . Cancer ([place])     basal cell; skin cancer.  Marland Kitchen History of rheumatic fever     age 49  . Macular degeneration     early stages    Past Surgical History  Procedure Laterality Date  . Cholecystectomy    . Hemorrhoid surgery      laser couple times  . Hemorrhoid surgery N/A 01/19/2015    Procedure: EXTENSIVE HEMORRHOIDECTOMY;  Surgeon: Aviva Signs, MD;  Location: [facility];  Service: General;  Laterality: N/A;    Medications:  HOME MEDS: Prior to Admission medications   Medication Sig Start Date End Date Taking? Authorizing Provider  BIOTIN PO Take 1 tablet by mouth daily.   Yes Historical Provider, MD  fluocinonide (LIDEX) 0.05 % external solution Apply 1 application topically daily. Applied to scalp   Yes Historical Provider, MD  lisinopril-hydrochlorothiazide (PRINZIDE,ZESTORETIC) 20-12.5 MG per tablet Take 1 tablet by mouth daily.   Yes Historical Provider, MD  Multiple Vitamins-Minerals (MULTIVITAMIN WITH MINERALS) tablet Take 1 tablet by mouth daily.   Yes Historical Provider, MD  Multiple Vitamins-Minerals (PRESERVISION AREDS PO) Take 1 tablet by mouth daily.   Yes Historical Provider, MD  Omega-3 Fatty Acids (FISH OIL) 1000 MG CAPS Take 1,000 mg by mouth daily.   Yes Historical Provider, MD  HYDROcodone-acetaminophen (NORCO/VICODIN) 5-325 MG tablet Take 1-2 tablets by mouth every 6 (six) hours as needed for moderate pain. Patient not taking: Reported on  02/24/2015 01/19/15 01/19/16  Aviva Signs, MD     Allergies:  No Known Allergies  Social History:   reports that he quit smoking about 11 years ago. His smoking use included Cigarettes. He has a 20 pack-year smoking history. He does not have any smokeless tobacco history on file. He reports that he drinks alcohol. He reports that he does not use illicit drugs.  Family History: Family History  Problem Relation Age of Onset  . Colon cancer Sister     age 62, deceased  . Emphysema Father     deceased age 31     Physical Exam: Filed Vitals:   02/24/15 2015 02/24/15 2030 02/24/15 2045 02/24/15 2100  BP:  106/69  123/89  Pulse: 72 72 75 75  Temp:      Resp:      Height:      Weight:      SpO2: 98% 95% 98% 97%   Blood pressure 123/89, pulse 75, temperature 97.9 F (36.6 C), resp. rate 40, height 5\' 11"  (1.803 m), weight 102.513 kg (226 lb), SpO2 97 %.  GEN:  Pleasant  patient lying in the stretcher in no acute distress; cooperative with exam. PSYCH:  alert and oriented x4; does not appear anxious or depressed; affect is appropriate. HEENT: Mucous membranes pink and anicteric; PERRLA; EOM intact; no cervical lymphadenopathy nor thyromegaly or carotid bruit; no JVD; There were no stridor. Neck is very supple. Breasts:: Not examined CHEST WALL: No tenderness CHEST: Normal respiration, clear to auscultation bilaterally.  HEART: Regular rate and rhythm.  There are no murmur, rub, or gallops.   BACK: No kyphosis or scoliosis; no CVA tenderness ABDOMEN: soft and non-tender; no masses, no organomegaly, normal abdominal bowel sounds; no pannus; no intertriginous candida. There is no rebound and no distention. Rectal Exam: Not done EXTREMITIES: No bone or joint deformity; age-appropriate arthropathy of the hands and knees; no edema; no ulcerations.  There is no calf tenderness. Genitalia: not examined PULSES: 2+ and symmetric SKIN: Normal hydration no rash or ulceration CNS: Cranial  nerves 2-12 grossly intact no focal lateralizing neurologic deficit.  Speech is fluent; uvula elevated with phonation, facial symmetry and tongue midline. DTR are normal bilaterally, cerebella exam is intact, barbinski is negative and strengths are equaled bilaterally.  No sensory loss.   Labs on Admission:  Basic Metabolic Panel:  Recent Labs Lab 02/24/15 1945  NA 139  K 3.4*  CL 106  CO2 23  GLUCOSE 129*  BUN 27*  CREATININE 0.99  CALCIUM 9.1    CBC:  Recent Labs Lab 02/24/15 1945  WBC 9.2  HGB 14.6  HCT 42.2  MCV 89.4  PLT 180   Cardiac Enzymes:  Recent Labs Lab 02/24/15 1945  TROPONINI <0.03   Radiological Exams on Admission: Dg Chest 2 View  02/24/2015  CLINICAL DATA:  Chest pain EXAM: CHEST  2 VIEW COMPARISON:  None. FINDINGS: Cardiomediastinal silhouette is unremarkable. No acute infiltrate or pleural effusion. No pulmonary edema. Mild degenerative changes thoracic spine. IMPRESSION: No active cardiopulmonary disease. Electronically Signed   By: Lahoma Crocker M.D.   On: 02/24/2015 19:52    EKG: Independently reviewed.    Assessment/Plan Present on Admission:  . Chest pain  PLAN:  Will admit him for atypical chest pain.  He will be started on ASA per day.  Will continue with his Lisinopril, but hold his HCTZ, as his symptoms may be aggravated with dehydration.  His BP in the ER is OK.  Will cycle his troponins, and obtain an ECHO, and place on telemetry to monitor for any arrythmia.   His K will be replaced orally.   He will likely need a stress test.  Will defer outpatient or inpatient to Dr Legrand Rams.  He is stable, full code, and will be admitted to his service.   Thank you so much.    Other plans as per orders.  Code Status: FULL Haskel Khan, MD. [facility] Pager [phone] 7pm to 7am.  02/24/2015, 9:53 PM

## 2015-02-24 NOTE — ED Provider Notes (Signed)
CSN: [place]:[id number]     Arrival date & time 02/24/15  1924 History   First MD Initiated Contact with Patient 02/24/15 2003     Chief Complaint  Patient presents with  . Chest Pain     (Consider location/radiation/quality/duration/timing/severity/associated sxs/prior Treatment) Patient is a 69 y.o. male presenting with chest pain. The history is provided by the patient.  Chest Pain Pain location:  Substernal area Associated symptoms: diaphoresis   Associated symptoms: no abdominal pain, no back pain and no palpitations    patient presents after episode of chest pain and lightheadedness. States he was sitting at home drinking beer and on the computer and began to have lower chest pain. Began to feel lightheaded and became diaphoretic and pale. Last around 5 minutes. No fevers or chills. His been doing well otherwise. No swelling in his legs. Has a history of hypertension but states there is no known coronary artery disease. History of rheumatic fever when he was 13. Does have chronic paralysis of vocal cords. He is a former smoker.  Past Medical History  Diagnosis Date  . Hypertension   . Paralysis of vocal cords     congenital  . Sleep apnea     has CPAP but doesnt use it. Pt has lost weight and does not need.  . Cancer ([place])     basal cell; skin cancer.  Marland Kitchen History of rheumatic fever     age 69  . Macular degeneration     early stages   Past Surgical History  Procedure Laterality Date  . Cholecystectomy    . Hemorrhoid surgery      laser couple times  . Hemorrhoid surgery N/A 01/19/2015    Procedure: EXTENSIVE HEMORRHOIDECTOMY;  Surgeon: Aviva Signs, MD;  Location: [facility];  Service: General;  Laterality: N/A;   Family History  Problem Relation Age of Onset  . Colon cancer Sister     age 5, deceased  . Emphysema Father     deceased age 57   Social History  Substance Use Topics  . Smoking status: Former Smoker -- 0.50 packs/day for 40 years    Types: Cigarettes    Quit  date: 09/03/2003  . Smokeless tobacco: None     Comment: intermittent, socially  . Alcohol Use: 0.0 oz/week    0 Standard drinks or equivalent per week     Comment: occasional only    Review of Systems  Constitutional: Positive for diaphoresis. Negative for appetite change.  Respiratory: Negative for chest tightness.   Cardiovascular: Positive for chest pain. Negative for palpitations.  Gastrointestinal: Negative for abdominal pain.  Genitourinary: Negative for hematuria.  Musculoskeletal: Negative for back pain.  Skin: Negative for pallor.  Neurological: Positive for light-headedness.  Hematological: Negative for adenopathy.      Allergies  Review of patient's allergies indicates no known allergies.  Home Medications   Prior to Admission medications   Medication Sig Start Date End Date Taking? Authorizing Provider  BIOTIN PO Take 1 tablet by mouth daily.   Yes Historical Provider, MD  fluocinonide (LIDEX) 0.05 % external solution Apply 1 application topically daily. Applied to scalp   Yes Historical Provider, MD  lisinopril-hydrochlorothiazide (PRINZIDE,ZESTORETIC) 20-12.5 MG per tablet Take 1 tablet by mouth daily.   Yes Historical Provider, MD  Multiple Vitamins-Minerals (MULTIVITAMIN WITH MINERALS) tablet Take 1 tablet by mouth daily.   Yes Historical Provider, MD  Multiple Vitamins-Minerals (PRESERVISION AREDS PO) Take 1 tablet by mouth daily.   Yes Historical Provider, MD  Omega-3 Fatty Acids (FISH OIL) 1000 MG CAPS Take 1,000 mg by mouth daily.   Yes Historical Provider, MD  HYDROcodone-acetaminophen (NORCO/VICODIN) 5-325 MG tablet Take 1-2 tablets by mouth every 6 (six) hours as needed for moderate pain. Patient not taking: Reported on 02/24/2015 01/19/15 01/19/16  Aviva Signs, MD   BP 123/89 mmHg  Pulse 75  Temp(Src) 97.9 F (36.6 C)  Resp 40  Ht 5\' 11"  (1.803 m)  Wt 226 lb (102.513 kg)  BMI 31.53 kg/m2  SpO2 97% Physical Exam  Constitutional: He appears  well-developed.  HENT:  Head: Atraumatic.  Eyes: EOM are normal.  Cardiovascular: Regular rhythm.   Pulmonary/Chest: Effort normal.  Abdominal: Soft. There is no tenderness.  Musculoskeletal: Normal range of motion. He exhibits no edema.  Neurological: He is alert.  Skin: Skin is warm.    ED Course  Procedures (including critical care time) Labs Review Labs Reviewed  BASIC METABOLIC PANEL - Abnormal; Notable for the following:    Potassium 3.4 (*)    Glucose, Bld 129 (*)    BUN 27 (*)    All other components within normal limits  CBC  TROPONIN I    Imaging Review Dg Chest 2 View  02/24/2015  CLINICAL DATA:  Chest pain EXAM: CHEST  2 VIEW COMPARISON:  None. FINDINGS: Cardiomediastinal silhouette is unremarkable. No acute infiltrate or pleural effusion. No pulmonary edema. Mild degenerative changes thoracic spine. IMPRESSION: No active cardiopulmonary disease. Electronically Signed   By: Lahoma Crocker M.D.   On: 02/24/2015 19:52   I have personally reviewed and evaluated these images and lab results as part of my medical decision-making.   EKG Interpretation   Date/Time:  Tuesday February 24 2015 19:28:58 EST Ventricular Rate:  74 PR Interval:  168 QRS Duration: 95 QT Interval:  401 QTC Calculation: 445 R Axis:   11 Text Interpretation:  Sinus rhythm Ventricular premature complex Inferior  infarct, old Baseline wander in lead(s) V1 Confirmed by Pattrick Bady  MD,  Renesmay Nesbitt [id number]) on 02/24/2015 8:15:07 PM      MDM   Final diagnoses:  Chest pain, unspecified chest pain type    Patient with episode of chest pain diaphoresis and lightheadedness. EKG overall reassuring. Enzymes negative. Does appear to need a cardiac rule out and monitoring for arrhythmia. Will admit to internal medicine.    Davonna Belling, MD 02/24/15 2153

## 2015-02-25 ENCOUNTER — Encounter (HOSPITAL_COMMUNITY): Payer: Self-pay | Admitting: *Deleted

## 2015-02-25 ENCOUNTER — Observation Stay (HOSPITAL_BASED_OUTPATIENT_CLINIC_OR_DEPARTMENT_OTHER): Payer: Commercial Managed Care - HMO

## 2015-02-25 DIAGNOSIS — R52 Pain, unspecified: Secondary | ICD-10-CM | POA: Diagnosis not present

## 2015-02-25 DIAGNOSIS — Z85828 Personal history of other malignant neoplasm of skin: Secondary | ICD-10-CM | POA: Diagnosis not present

## 2015-02-25 DIAGNOSIS — G4730 Sleep apnea, unspecified: Secondary | ICD-10-CM | POA: Diagnosis not present

## 2015-02-25 DIAGNOSIS — R42 Dizziness and giddiness: Secondary | ICD-10-CM | POA: Diagnosis not present

## 2015-02-25 DIAGNOSIS — H3530 Unspecified macular degeneration: Secondary | ICD-10-CM | POA: Diagnosis not present

## 2015-02-25 DIAGNOSIS — R079 Chest pain, unspecified: Secondary | ICD-10-CM | POA: Diagnosis not present

## 2015-02-25 DIAGNOSIS — Z87891 Personal history of nicotine dependence: Secondary | ICD-10-CM | POA: Diagnosis not present

## 2015-02-25 DIAGNOSIS — J3800 Paralysis of vocal cords and larynx, unspecified: Secondary | ICD-10-CM | POA: Diagnosis not present

## 2015-02-25 DIAGNOSIS — Z79899 Other long term (current) drug therapy: Secondary | ICD-10-CM | POA: Diagnosis not present

## 2015-02-25 DIAGNOSIS — I10 Essential (primary) hypertension: Secondary | ICD-10-CM | POA: Diagnosis not present

## 2015-02-25 LAB — TROPONIN I: Troponin I: <0.03 ng/mL (ref <0.031)

## 2015-02-25 LAB — LIPID PANEL
CHOL/HDL RATIO: 4.4 ratio
Cholesterol: 172 mg/dL (ref 0–200)
HDL: 39 mg/dL — ABNORMAL LOW (ref >40)
LDL Cholesterol: 107 mg/dL — ABNORMAL HIGH (ref 0–99)
Triglycerides: 131 mg/dL (ref <150)
VLDL: 26 mg/dL (ref 0–40)

## 2015-02-25 LAB — TSH: TSH: 3.455 u[IU]/mL (ref 0.350–4.500)

## 2015-02-25 MED ORDER — LORAZEPAM 2 MG/ML IJ SOLN: 1.0000 mg | Freq: Four times a day (QID) | INTRAMUSCULAR | Status: DC | PRN

## 2015-02-25 MED ORDER — POTASSIUM CHLORIDE CRYS ER 20 MEQ PO TBCR
EXTENDED_RELEASE_TABLET | ORAL | Status: AC
  Filled 2015-02-25: qty 2

## 2015-02-25 MED ORDER — ASPIRIN 81 MG PO TBEC: 81.0000 mg | DELAYED_RELEASE_TABLET | Freq: Every day | ORAL | Status: AC

## 2015-02-25 MED ORDER — THIAMINE HCL 100 MG/ML IJ SOLN: 100.0000 mg | Freq: Every day | INTRAMUSCULAR | Status: DC

## 2015-02-25 MED ORDER — SODIUM CHLORIDE 0.9 % IV SOLN
INTRAVENOUS | Status: DC
  Administered 2015-02-25: via INTRAVENOUS

## 2015-02-25 MED ORDER — LISINOPRIL 10 MG PO TABS: 10.0000 mg | ORAL_TABLET | Freq: Every day | ORAL | Status: DC

## 2015-02-25 MED ORDER — ADULT MULTIVITAMIN W/MINERALS CH: 1.0000 | ORAL_TABLET | Freq: Every day | ORAL | Status: DC

## 2015-02-25 MED ORDER — MORPHINE SULFATE (PF) 2 MG/ML IV SOLN: 2.0000 mg | INTRAVENOUS | Status: DC | PRN

## 2015-02-25 MED ORDER — FOLIC ACID 1 MG PO TABS: 1.0000 mg | ORAL_TABLET | Freq: Every day | ORAL | Status: DC

## 2015-02-25 MED ORDER — VITAMIN B-1 100 MG PO TABS: 100.0000 mg | ORAL_TABLET | Freq: Every day | ORAL | Status: DC

## 2015-02-25 MED ORDER — POTASSIUM CHLORIDE CRYS ER 20 MEQ PO TBCR
40.0000 meq | EXTENDED_RELEASE_TABLET | Freq: Every day | ORAL | Status: DC
  Administered 2015-02-25: 40 meq via ORAL

## 2015-02-25 MED ORDER — OMEGA-3-ACID ETHYL ESTERS 1 G PO CAPS
1.0000 g | ORAL_CAPSULE | Freq: Two times a day (BID) | ORAL | Status: DC
  Administered 2015-02-25: 1 g via ORAL

## 2015-02-25 MED ORDER — ONDANSETRON HCL 4 MG/2ML IJ SOLN: 4.0000 mg | Freq: Four times a day (QID) | INTRAMUSCULAR | Status: DC | PRN

## 2015-02-25 MED ORDER — OMEGA-3-ACID ETHYL ESTERS 1 G PO CAPS
ORAL_CAPSULE | ORAL | Status: AC
  Filled 2015-02-25: qty 1

## 2015-02-25 MED ORDER — ASPIRIN EC 81 MG PO TBEC: 81.0000 mg | DELAYED_RELEASE_TABLET | Freq: Every day | ORAL | Status: DC

## 2015-02-25 MED ORDER — LORAZEPAM 1 MG PO TABS: 1.0000 mg | ORAL_TABLET | Freq: Four times a day (QID) | ORAL | Status: DC | PRN

## 2015-02-25 MED ORDER — ENOXAPARIN SODIUM 40 MG/0.4ML ~~LOC~~ SOLN: 40.0000 mg | SUBCUTANEOUS | Status: DC

## 2015-02-25 MED ORDER — ONDANSETRON HCL 4 MG PO TABS: 4.0000 mg | ORAL_TABLET | Freq: Four times a day (QID) | ORAL | Status: DC | PRN

## 2015-02-25 NOTE — Discharge Summary (Signed)
Physician Discharge Summary  Patient ID: Shayla Donnel MRN: [id number] DOB/AGE: Jun 20, 1945 69 y.o. Primary Care Physician:Eusevio Schriver, MD Admit date: 02/24/2015 Discharge date: 02/25/2015    Discharge Diagnoses:   Principal Problem:   Chest pain Active Problems:   HTN (hypertension)   Sleep apnea     Medication List    TAKE these medications        aspirin 81 MG EC tablet  Take 1 tablet (81 mg total) by mouth daily.     BIOTIN PO  Take 1 tablet by mouth daily.     Fish Oil 1000 MG Caps  Take 1,000 mg by mouth daily.     fluocinonide 0.05 % external solution  Commonly known as:  LIDEX  Apply 1 application topically daily. Applied to scalp     HYDROcodone-acetaminophen 5-325 MG tablet  Commonly known as:  NORCO/VICODIN  Take 1-2 tablets by mouth every 6 (six) hours as needed for moderate pain.     lisinopril-hydrochlorothiazide 20-12.5 MG tablet  Commonly known as:  PRINZIDE,ZESTORETIC  Take 1 tablet by mouth daily.     multivitamin with minerals tablet  Take 1 tablet by mouth daily.     PRESERVISION AREDS PO  Take 1 tablet by mouth daily.        Discharged Condition: improved    Consults: none  Significant Diagnostic Studies: Dg Chest 2 View  02/24/2015  CLINICAL DATA:  Chest pain EXAM: CHEST  2 VIEW COMPARISON:  None. FINDINGS: Cardiomediastinal silhouette is unremarkable. No acute infiltrate or pleural effusion. No pulmonary edema. Mild degenerative changes thoracic spine. IMPRESSION: No active cardiopulmonary disease. Electronically Signed   By: Lahoma Crocker M.D.   On: 02/24/2015 19:52    Lab Results: Basic Metabolic Panel:  Recent Labs  02/24/15 1945  NA 139  K 3.4*  CL 106  CO2 23  GLUCOSE 129*  BUN 27*  CREATININE 0.99  CALCIUM 9.1   Liver Function Tests: No results for input(s): AST, ALT, ALKPHOS, BILITOT, PROT, ALBUMIN in the last 72 hours.   CBC:  Recent Labs  02/24/15 1945  WBC 9.2  HGB 14.6  HCT 42.2  MCV 89.4   PLT 180    No results found for this or any previous visit (from the past 240 hour(s)).   Hospital Course:   This is a 69 years old male with history of hypertension and congential vocal cord paralysis as admitted due to chest pain. No history of coronary artery disease. His EKG and serial cardiac enzyme was negative for ACS. His chest pain has subsided. Patient will discharged home and will be followed in out patient.  Discharge Exam: Blood pressure 118/87, pulse 73, temperature 98 F (36.7 C), temperature source Oral, resp. rate 18, height 5\' 11"  (1.803 m), weight 102.785 kg (226 lb 9.6 oz), SpO2 98 %.   Disposition:  home        Follow-up Information    Follow up with [facility], MD In 1 week.   Specialty:  Internal Medicine   Contact information:   [place] [address] [phone]       Signed: Rosita Fire   02/25/2015, 8:31 AM

## 2015-02-25 NOTE — Care Management Note (Signed)
Case Management Note  Patient Details  Name: Roger Gould MRN: [id number] Date of Birth: 1945-12-28  Subjective/Objective:                  Pt admitted from home with CP. Pt lives with his wife and will return home at discharge. Pt is independent with ADL's.  Action/Plan: No CM needs noted. Pt for discharge home today.  Expected Discharge Date:                  Expected Discharge Plan:  Home/Self Care  In-House Referral:  NA  Discharge planning Services  CM Consult  Post Acute Care Choice:  NA Choice offered to:  NA  DME Arranged:    DME Agency:     HH Arranged:    HH Agency:     Status of Service:  Completed, signed off  Medicare Important Message Given:    Date Medicare IM Given:    Medicare IM give by:    Date Additional Medicare IM Given:    Additional Medicare Important Message give by:     If discussed at [place] of Stay Meetings, dates discussed:    Additional Comments:  Joylene Draft, RN 02/25/2015, 9:16 AM

## 2015-02-25 NOTE — Progress Notes (Signed)
Patient with orders to be discharge home. Discharge instructions given, patient verbalized understanding. Patient stable. Patient left in private vehicle with family.  

## 2015-02-26 DIAGNOSIS — R079 Chest pain, unspecified: Secondary | ICD-10-CM | POA: Diagnosis not present

## 2015-02-26 DIAGNOSIS — I10 Essential (primary) hypertension: Secondary | ICD-10-CM | POA: Diagnosis not present

## 2015-02-26 DIAGNOSIS — R52 Pain, unspecified: Secondary | ICD-10-CM | POA: Diagnosis not present

## 2015-03-04 DIAGNOSIS — I10 Essential (primary) hypertension: Secondary | ICD-10-CM | POA: Diagnosis not present

## 2015-03-04 DIAGNOSIS — E669 Obesity, unspecified: Secondary | ICD-10-CM | POA: Diagnosis not present

## 2015-03-04 DIAGNOSIS — J3800 Paralysis of vocal cords and larynx, unspecified: Secondary | ICD-10-CM | POA: Diagnosis not present

## 2015-03-04 DIAGNOSIS — R079 Chest pain, unspecified: Secondary | ICD-10-CM | POA: Diagnosis not present

## 2015-03-24 DIAGNOSIS — L570 Actinic keratosis: Secondary | ICD-10-CM | POA: Diagnosis not present

## 2015-06-08 DIAGNOSIS — H3530 Unspecified macular degeneration: Secondary | ICD-10-CM | POA: Diagnosis not present

## 2015-06-08 DIAGNOSIS — I10 Essential (primary) hypertension: Secondary | ICD-10-CM | POA: Diagnosis not present

## 2015-06-08 DIAGNOSIS — J3800 Paralysis of vocal cords and larynx, unspecified: Secondary | ICD-10-CM | POA: Diagnosis not present

## 2015-09-08 DIAGNOSIS — Z6831 Body mass index (BMI) 31.0-31.9, adult: Secondary | ICD-10-CM | POA: Diagnosis not present

## 2015-09-08 DIAGNOSIS — Z0000 Encounter for general adult medical examination without abnormal findings: Secondary | ICD-10-CM | POA: Diagnosis not present

## 2015-09-08 DIAGNOSIS — I10 Essential (primary) hypertension: Secondary | ICD-10-CM | POA: Diagnosis not present

## 2015-09-08 DIAGNOSIS — J3800 Paralysis of vocal cords and larynx, unspecified: Secondary | ICD-10-CM | POA: Diagnosis not present

## 2015-12-07 ENCOUNTER — Other Ambulatory Visit (HOSPITAL_COMMUNITY): Payer: Self-pay | Admitting: Internal Medicine

## 2015-12-07 DIAGNOSIS — I10 Essential (primary) hypertension: Secondary | ICD-10-CM | POA: Diagnosis not present

## 2015-12-07 DIAGNOSIS — R319 Hematuria, unspecified: Secondary | ICD-10-CM | POA: Diagnosis not present

## 2015-12-07 DIAGNOSIS — N390 Urinary tract infection, site not specified: Secondary | ICD-10-CM | POA: Diagnosis not present

## 2015-12-07 DIAGNOSIS — R300 Dysuria: Secondary | ICD-10-CM | POA: Diagnosis not present

## 2015-12-07 DIAGNOSIS — R1084 Generalized abdominal pain: Secondary | ICD-10-CM

## 2015-12-07 DIAGNOSIS — Z0000 Encounter for general adult medical examination without abnormal findings: Secondary | ICD-10-CM | POA: Diagnosis not present

## 2015-12-07 DIAGNOSIS — J3800 Paralysis of vocal cords and larynx, unspecified: Secondary | ICD-10-CM | POA: Diagnosis not present

## 2015-12-10 ENCOUNTER — Ambulatory Visit (HOSPITAL_COMMUNITY)
Admission: RE | Admit: 2015-12-10 | Discharge: 2015-12-10 | Disposition: A | Discharge status: Home / Self Care | Payer: Commercial Managed Care - HMO | Source: Ambulatory Visit | Attending: Internal Medicine | Admitting: Internal Medicine

## 2015-12-10 DIAGNOSIS — N2889 Other specified disorders of kidney and ureter: Secondary | ICD-10-CM | POA: Insufficient documentation

## 2015-12-10 DIAGNOSIS — K769 Liver disease, unspecified: Secondary | ICD-10-CM | POA: Diagnosis not present

## 2015-12-10 DIAGNOSIS — R109 Unspecified abdominal pain: Secondary | ICD-10-CM | POA: Diagnosis not present

## 2015-12-10 DIAGNOSIS — Z9049 Acquired absence of other specified parts of digestive tract: Secondary | ICD-10-CM | POA: Diagnosis not present

## 2015-12-10 DIAGNOSIS — R1084 Generalized abdominal pain: Secondary | ICD-10-CM

## 2015-12-15 ENCOUNTER — Other Ambulatory Visit (HOSPITAL_COMMUNITY): Payer: Self-pay | Admitting: Internal Medicine

## 2015-12-15 ENCOUNTER — Ambulatory Visit (HOSPITAL_COMMUNITY)
Admission: RE | Admit: 2015-12-15 | Discharge: 2015-12-15 | Disposition: A | Discharge status: Home / Self Care | Payer: Commercial Managed Care - HMO | Source: Ambulatory Visit | Attending: Internal Medicine | Admitting: Internal Medicine

## 2015-12-15 DIAGNOSIS — R0602 Shortness of breath: Secondary | ICD-10-CM

## 2015-12-15 DIAGNOSIS — N200 Calculus of kidney: Secondary | ICD-10-CM | POA: Diagnosis not present

## 2015-12-15 DIAGNOSIS — K760 Fatty (change of) liver, not elsewhere classified: Secondary | ICD-10-CM | POA: Diagnosis not present

## 2015-12-15 DIAGNOSIS — Z23 Encounter for immunization: Secondary | ICD-10-CM | POA: Diagnosis not present

## 2015-12-25 ENCOUNTER — Ambulatory Visit (HOSPITAL_COMMUNITY): Admission: RE | Admit: 2015-12-25 | Payer: Commercial Managed Care - HMO | Source: Ambulatory Visit

## 2015-12-25 ENCOUNTER — Other Ambulatory Visit (HOSPITAL_COMMUNITY): Payer: Self-pay | Admitting: Internal Medicine

## 2015-12-25 ENCOUNTER — Ambulatory Visit (HOSPITAL_COMMUNITY)
Admission: RE | Admit: 2015-12-25 | Discharge: 2015-12-25 | Disposition: A | Discharge status: Home / Self Care | Payer: Commercial Managed Care - HMO | Source: Ambulatory Visit | Attending: Internal Medicine | Admitting: Internal Medicine

## 2015-12-25 DIAGNOSIS — R0789 Other chest pain: Secondary | ICD-10-CM

## 2015-12-25 DIAGNOSIS — R079 Chest pain, unspecified: Secondary | ICD-10-CM | POA: Insufficient documentation

## 2015-12-25 DIAGNOSIS — I340 Nonrheumatic mitral (valve) insufficiency: Secondary | ICD-10-CM | POA: Diagnosis not present

## 2015-12-25 NOTE — Progress Notes (Signed)
  Echocardiogram 2D Echocardiogram has been performed.  Roger Gould M 12/25/2015, 1:07 PM

## 2016-01-14 DIAGNOSIS — H353131 Nonexudative age-related macular degeneration, bilateral, early dry stage: Secondary | ICD-10-CM | POA: Diagnosis not present

## 2016-01-14 DIAGNOSIS — H2511 Age-related nuclear cataract, right eye: Secondary | ICD-10-CM | POA: Diagnosis not present

## 2016-01-14 DIAGNOSIS — H2513 Age-related nuclear cataract, bilateral: Secondary | ICD-10-CM | POA: Diagnosis not present

## 2016-01-14 DIAGNOSIS — H43811 Vitreous degeneration, right eye: Secondary | ICD-10-CM | POA: Diagnosis not present

## 2016-01-14 DIAGNOSIS — H2512 Age-related nuclear cataract, left eye: Secondary | ICD-10-CM | POA: Diagnosis not present

## 2016-01-19 ENCOUNTER — Other Ambulatory Visit (HOSPITAL_COMMUNITY)
Admission: RE | Admit: 2016-01-19 | Discharge: 2016-01-19 | Disposition: A | Discharge status: Home / Self Care | Payer: Commercial Managed Care - HMO | Source: Ambulatory Visit | Attending: Urology | Admitting: Urology

## 2016-01-19 ENCOUNTER — Ambulatory Visit (INDEPENDENT_AMBULATORY_CARE_PROVIDER_SITE_OTHER): Payer: Commercial Managed Care - HMO | Admitting: Urology

## 2016-01-19 DIAGNOSIS — N5201 Erectile dysfunction due to arterial insufficiency: Secondary | ICD-10-CM | POA: Diagnosis not present

## 2016-01-19 DIAGNOSIS — R311 Benign essential microscopic hematuria: Secondary | ICD-10-CM | POA: Insufficient documentation

## 2016-01-19 LAB — URINALYSIS, ROUTINE W REFLEX MICROSCOPIC
Bilirubin Urine: NEGATIVE
GLUCOSE, UA: NEGATIVE mg/dL
Ketones, ur: NEGATIVE mg/dL
LEUKOCYTES UA: NEGATIVE
Nitrite: NEGATIVE
PH: 6 (ref 5.0–8.0)
PROTEIN: NEGATIVE mg/dL
SPECIFIC GRAVITY, URINE: 1.01 (ref 1.005–1.030)

## 2016-01-19 LAB — URINE MICROSCOPIC-ADD ON

## 2016-03-09 DIAGNOSIS — J3800 Paralysis of vocal cords and larynx, unspecified: Secondary | ICD-10-CM | POA: Diagnosis not present

## 2016-03-09 DIAGNOSIS — Z6831 Body mass index (BMI) 31.0-31.9, adult: Secondary | ICD-10-CM | POA: Diagnosis not present

## 2016-03-09 DIAGNOSIS — I10 Essential (primary) hypertension: Secondary | ICD-10-CM | POA: Diagnosis not present

## 2016-06-16 DIAGNOSIS — I10 Essential (primary) hypertension: Secondary | ICD-10-CM | POA: Diagnosis not present

## 2016-06-16 DIAGNOSIS — K760 Fatty (change of) liver, not elsewhere classified: Secondary | ICD-10-CM | POA: Diagnosis not present

## 2016-06-16 DIAGNOSIS — J3800 Paralysis of vocal cords and larynx, unspecified: Secondary | ICD-10-CM | POA: Diagnosis not present

## 2016-08-30 DIAGNOSIS — L570 Actinic keratosis: Secondary | ICD-10-CM | POA: Diagnosis not present

## 2016-08-30 DIAGNOSIS — L821 Other seborrheic keratosis: Secondary | ICD-10-CM | POA: Diagnosis not present

## 2016-08-30 DIAGNOSIS — D229 Melanocytic nevi, unspecified: Secondary | ICD-10-CM | POA: Diagnosis not present

## 2016-08-31 DIAGNOSIS — H52203 Unspecified astigmatism, bilateral: Secondary | ICD-10-CM | POA: Diagnosis not present

## 2016-09-20 IMAGING — DX DG CHEST 2V
2 series · 2 of 2 positions shown · non-contrast
Comparison: None.

CLINICAL DATA: Chest pain

EXAM:
CHEST  2 VIEW

[chest pa]
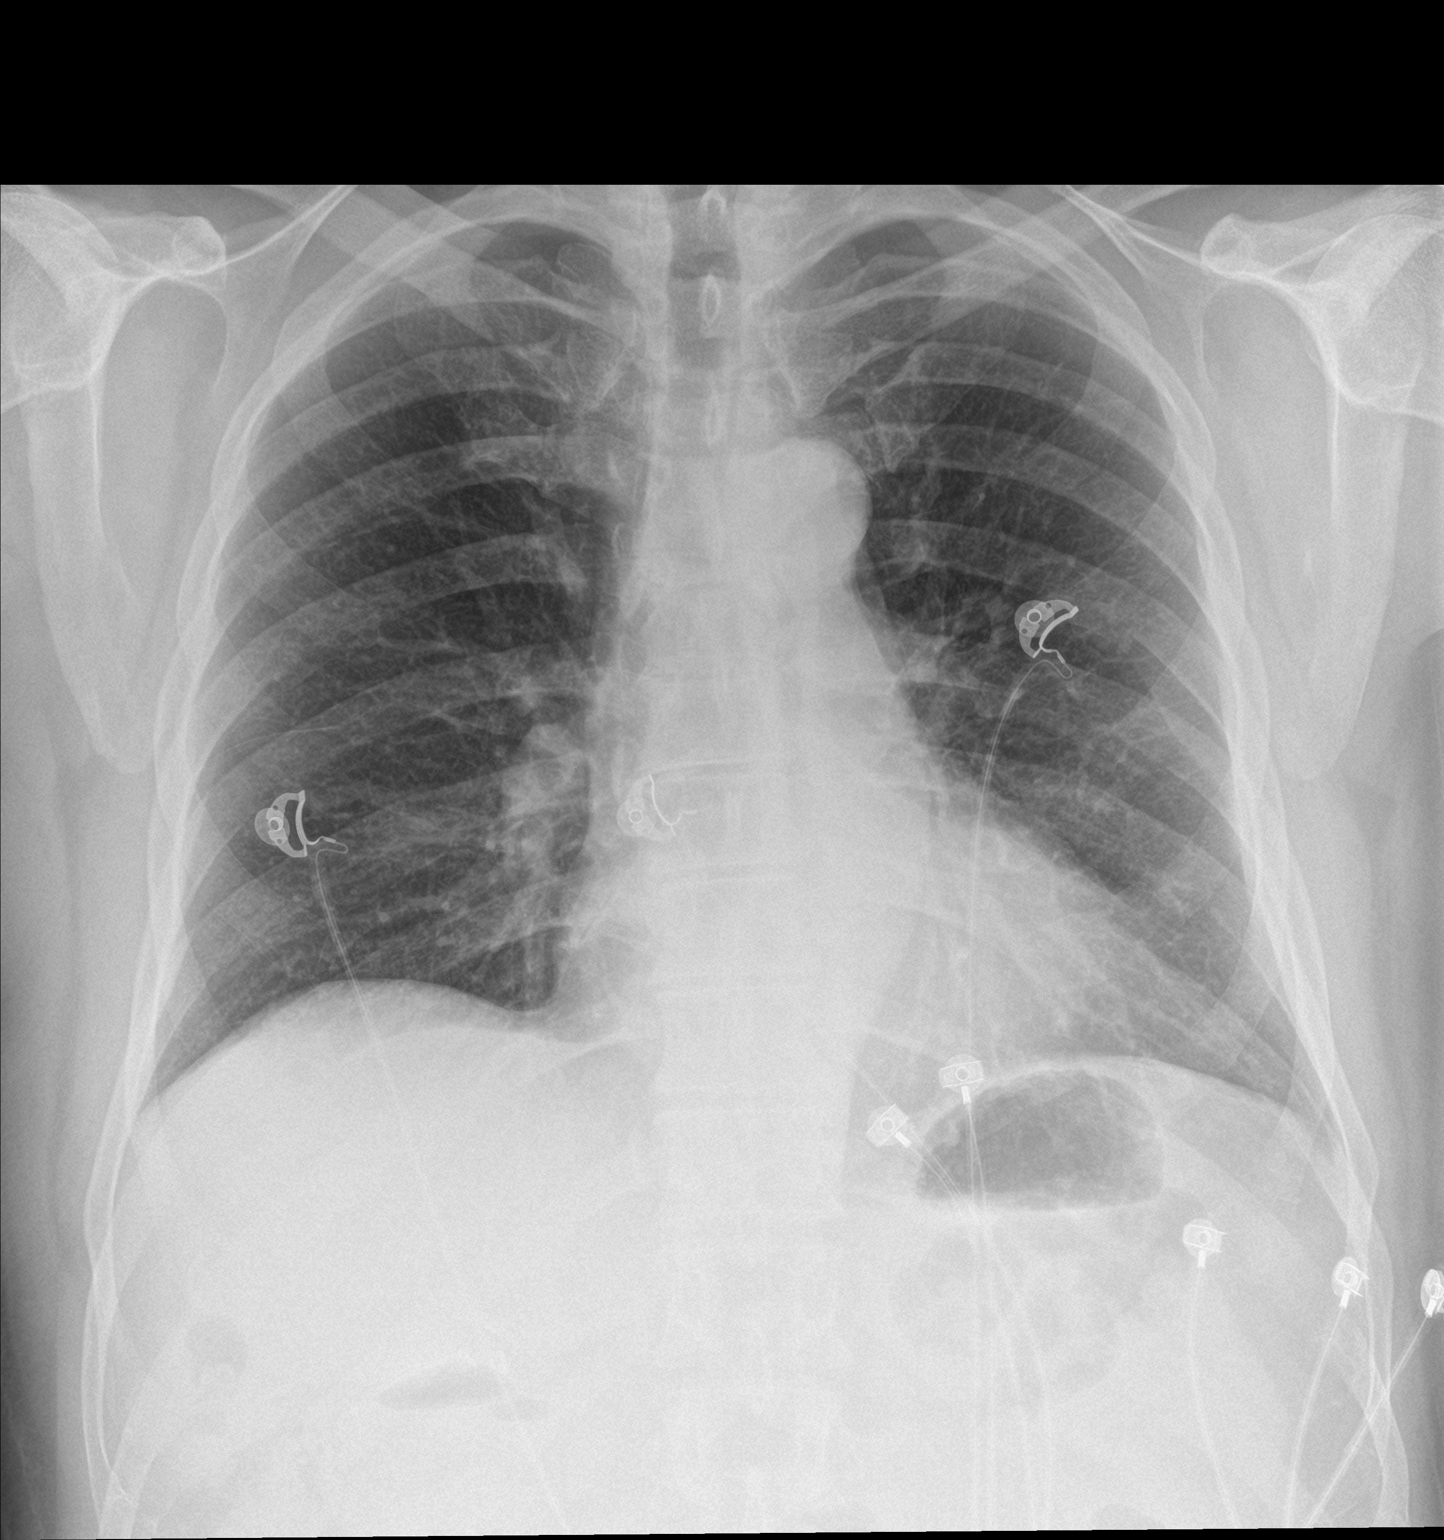

[chest lat]
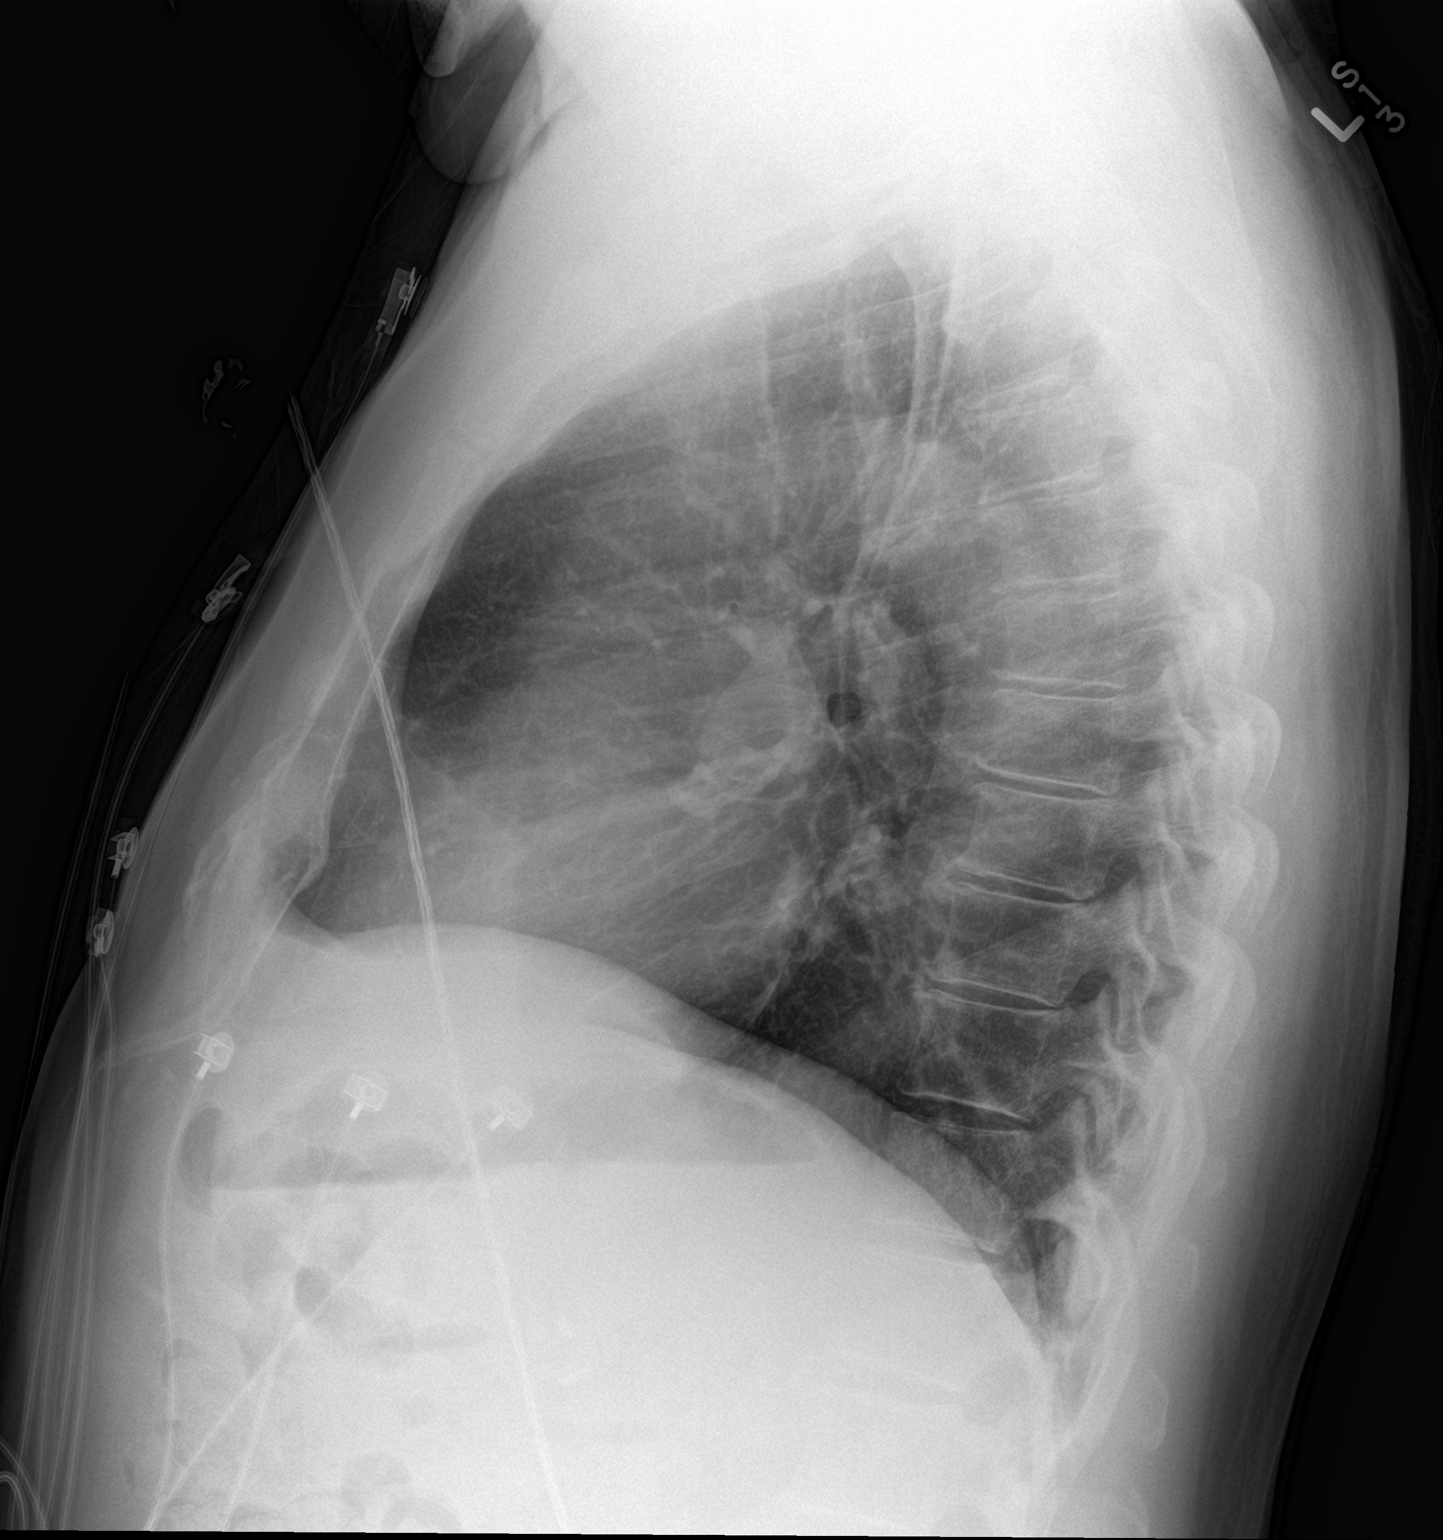

[2 of 2 positions shown; findings below may reference images not displayed]

FINDINGS: Cardiomediastinal silhouette is unremarkable. No acute infiltrate or
pleural effusion. No pulmonary edema. Mild degenerative changes
thoracic spine.
IMPRESSION: No active cardiopulmonary disease.

## 2016-10-17 DIAGNOSIS — J3800 Paralysis of vocal cords and larynx, unspecified: Secondary | ICD-10-CM | POA: Diagnosis not present

## 2016-10-17 DIAGNOSIS — Z6833 Body mass index (BMI) 33.0-33.9, adult: Secondary | ICD-10-CM | POA: Diagnosis not present

## 2016-10-17 DIAGNOSIS — I10 Essential (primary) hypertension: Secondary | ICD-10-CM | POA: Diagnosis not present

## 2017-01-04 DIAGNOSIS — D229 Melanocytic nevi, unspecified: Secondary | ICD-10-CM | POA: Diagnosis not present

## 2017-01-04 DIAGNOSIS — L821 Other seborrheic keratosis: Secondary | ICD-10-CM | POA: Diagnosis not present

## 2017-01-04 DIAGNOSIS — L570 Actinic keratosis: Secondary | ICD-10-CM | POA: Diagnosis not present

## 2017-01-26 DIAGNOSIS — Z6833 Body mass index (BMI) 33.0-33.9, adult: Secondary | ICD-10-CM | POA: Diagnosis not present

## 2017-01-26 DIAGNOSIS — Z23 Encounter for immunization: Secondary | ICD-10-CM | POA: Diagnosis not present

## 2017-01-26 DIAGNOSIS — J3800 Paralysis of vocal cords and larynx, unspecified: Secondary | ICD-10-CM | POA: Diagnosis not present

## 2017-01-26 DIAGNOSIS — Z1389 Encounter for screening for other disorder: Secondary | ICD-10-CM | POA: Diagnosis not present

## 2017-01-26 DIAGNOSIS — Z0000 Encounter for general adult medical examination without abnormal findings: Secondary | ICD-10-CM | POA: Diagnosis not present

## 2017-01-26 DIAGNOSIS — R300 Dysuria: Secondary | ICD-10-CM | POA: Diagnosis not present

## 2017-01-26 DIAGNOSIS — I10 Essential (primary) hypertension: Secondary | ICD-10-CM | POA: Diagnosis not present

## 2017-04-24 ENCOUNTER — Emergency Department (HOSPITAL_COMMUNITY): Payer: Medicare HMO

## 2017-04-24 ENCOUNTER — Encounter (HOSPITAL_COMMUNITY): Payer: Self-pay | Admitting: Emergency Medicine

## 2017-04-24 ENCOUNTER — Other Ambulatory Visit: Payer: Self-pay

## 2017-04-24 ENCOUNTER — Emergency Department (HOSPITAL_COMMUNITY)
Admission: EM | Admit: 2017-04-24 | Discharge: 2017-04-24 | Disposition: A | Discharge status: Home / Self Care | Payer: Medicare HMO | Attending: Emergency Medicine | Admitting: Emergency Medicine

## 2017-04-24 DIAGNOSIS — C4490 Unspecified malignant neoplasm of skin, unspecified: Secondary | ICD-10-CM | POA: Diagnosis not present

## 2017-04-24 DIAGNOSIS — R0602 Shortness of breath: Secondary | ICD-10-CM | POA: Diagnosis not present

## 2017-04-24 DIAGNOSIS — J3800 Paralysis of vocal cords and larynx, unspecified: Secondary | ICD-10-CM | POA: Diagnosis not present

## 2017-04-24 DIAGNOSIS — R002 Palpitations: Secondary | ICD-10-CM | POA: Diagnosis not present

## 2017-04-24 DIAGNOSIS — Z5321 Procedure and treatment not carried out due to patient leaving prior to being seen by health care provider: Secondary | ICD-10-CM | POA: Insufficient documentation

## 2017-04-24 DIAGNOSIS — I10 Essential (primary) hypertension: Secondary | ICD-10-CM | POA: Diagnosis not present

## 2017-04-24 DIAGNOSIS — H3530 Unspecified macular degeneration: Secondary | ICD-10-CM | POA: Diagnosis not present

## 2017-04-24 HISTORY — DX: Cardiac murmur, unspecified: R01.1

## 2017-04-24 LAB — CBC WITH DIFFERENTIAL/PLATELET
Basophils Absolute: 0.1 10*3/uL (ref 0.0–0.1)
Basophils Relative: 1 %
Eosinophils Absolute: 0.1 10*3/uL (ref 0.0–0.7)
Eosinophils Relative: 1 %
HEMATOCRIT: 42.9 % (ref 39.0–52.0)
HEMOGLOBIN: 14.7 g/dL (ref 13.0–17.0)
LYMPHS ABS: 1.6 10*3/uL (ref 0.7–4.0)
LYMPHS PCT: 21 %
MCH: 30.2 pg (ref 26.0–34.0)
MCHC: 34.3 g/dL (ref 30.0–36.0)
MCV: 88.3 fL (ref 78.0–100.0)
MONOS PCT: 11 %
Monocytes Absolute: 0.8 10*3/uL (ref 0.1–1.0)
NEUTROS PCT: 66 %
Neutro Abs: 5.1 10*3/uL (ref 1.7–7.7)
Platelets: 219 10*3/uL (ref 150–400)
RBC: 4.86 MIL/uL (ref 4.22–5.81)
RDW: 13 % (ref 11.5–15.5)
WBC: 7.6 10*3/uL (ref 4.0–10.5)

## 2017-04-24 LAB — BASIC METABOLIC PANEL
ANION GAP: 12 (ref 5–15)
BUN: 17 mg/dL (ref 6–20)
CALCIUM: 9.5 mg/dL (ref 8.9–10.3)
CO2: 25 mmol/L (ref 22–32)
Chloride: 100 mmol/L — ABNORMAL LOW (ref 101–111)
Creatinine, Ser: 0.98 mg/dL (ref 0.61–1.24)
GFR calc Af Amer: >60 mL/min (ref >60)
GLUCOSE: 182 mg/dL — AB (ref 65–99)
Potassium: 3.1 mmol/L — ABNORMAL LOW (ref 3.5–5.1)
Sodium: 137 mmol/L (ref 135–145)

## 2017-04-24 LAB — TROPONIN I: Troponin I: <0.03 ng/mL (ref <0.03)

## 2017-04-24 NOTE — ED Notes (Signed)
Not in waiting area x 2

## 2017-04-24 NOTE — ED Triage Notes (Signed)
Pt sent from dr Legrand Rams. Pt states has gen weakness, feels like hr is too high sometimes. Denies any pain/pressure or tightness to chest. C/o sob. A/o. No obviouis sob noted in triage

## 2017-04-24 NOTE — ED Notes (Signed)
Pt not in waiting area x 1

## 2017-07-11 IMAGING — DX DG CHEST 2V
2 series · 2 of 2 positions shown · non-contrast
Comparison: 02/24/2015

CLINICAL DATA: SOB X 2 MONTHS, PARALYZED VOCAL CORDS, HTN, FORMER
SMOKER X 2 YEARS

EXAM:
CHEST  2 VIEW

[chest pa]
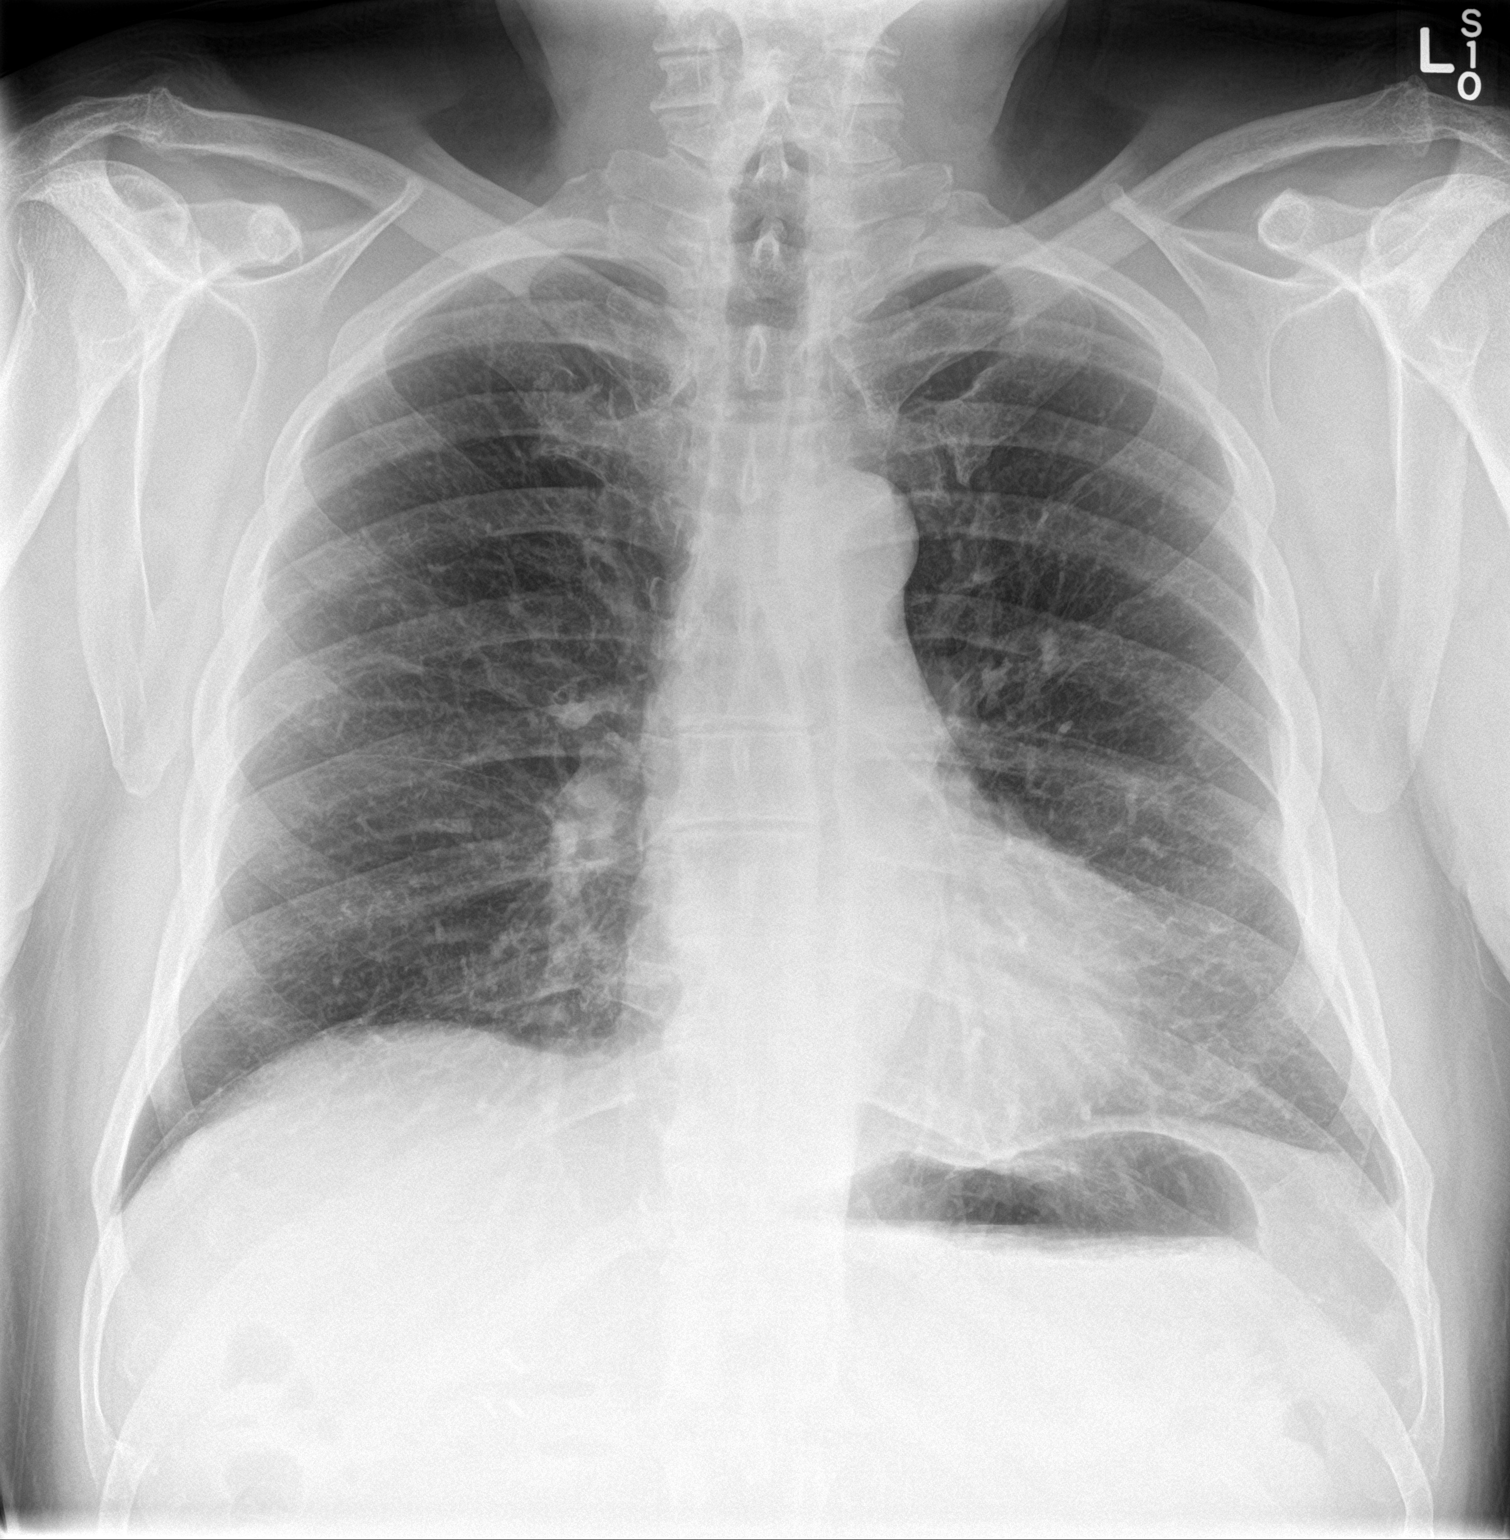

[chest lat]
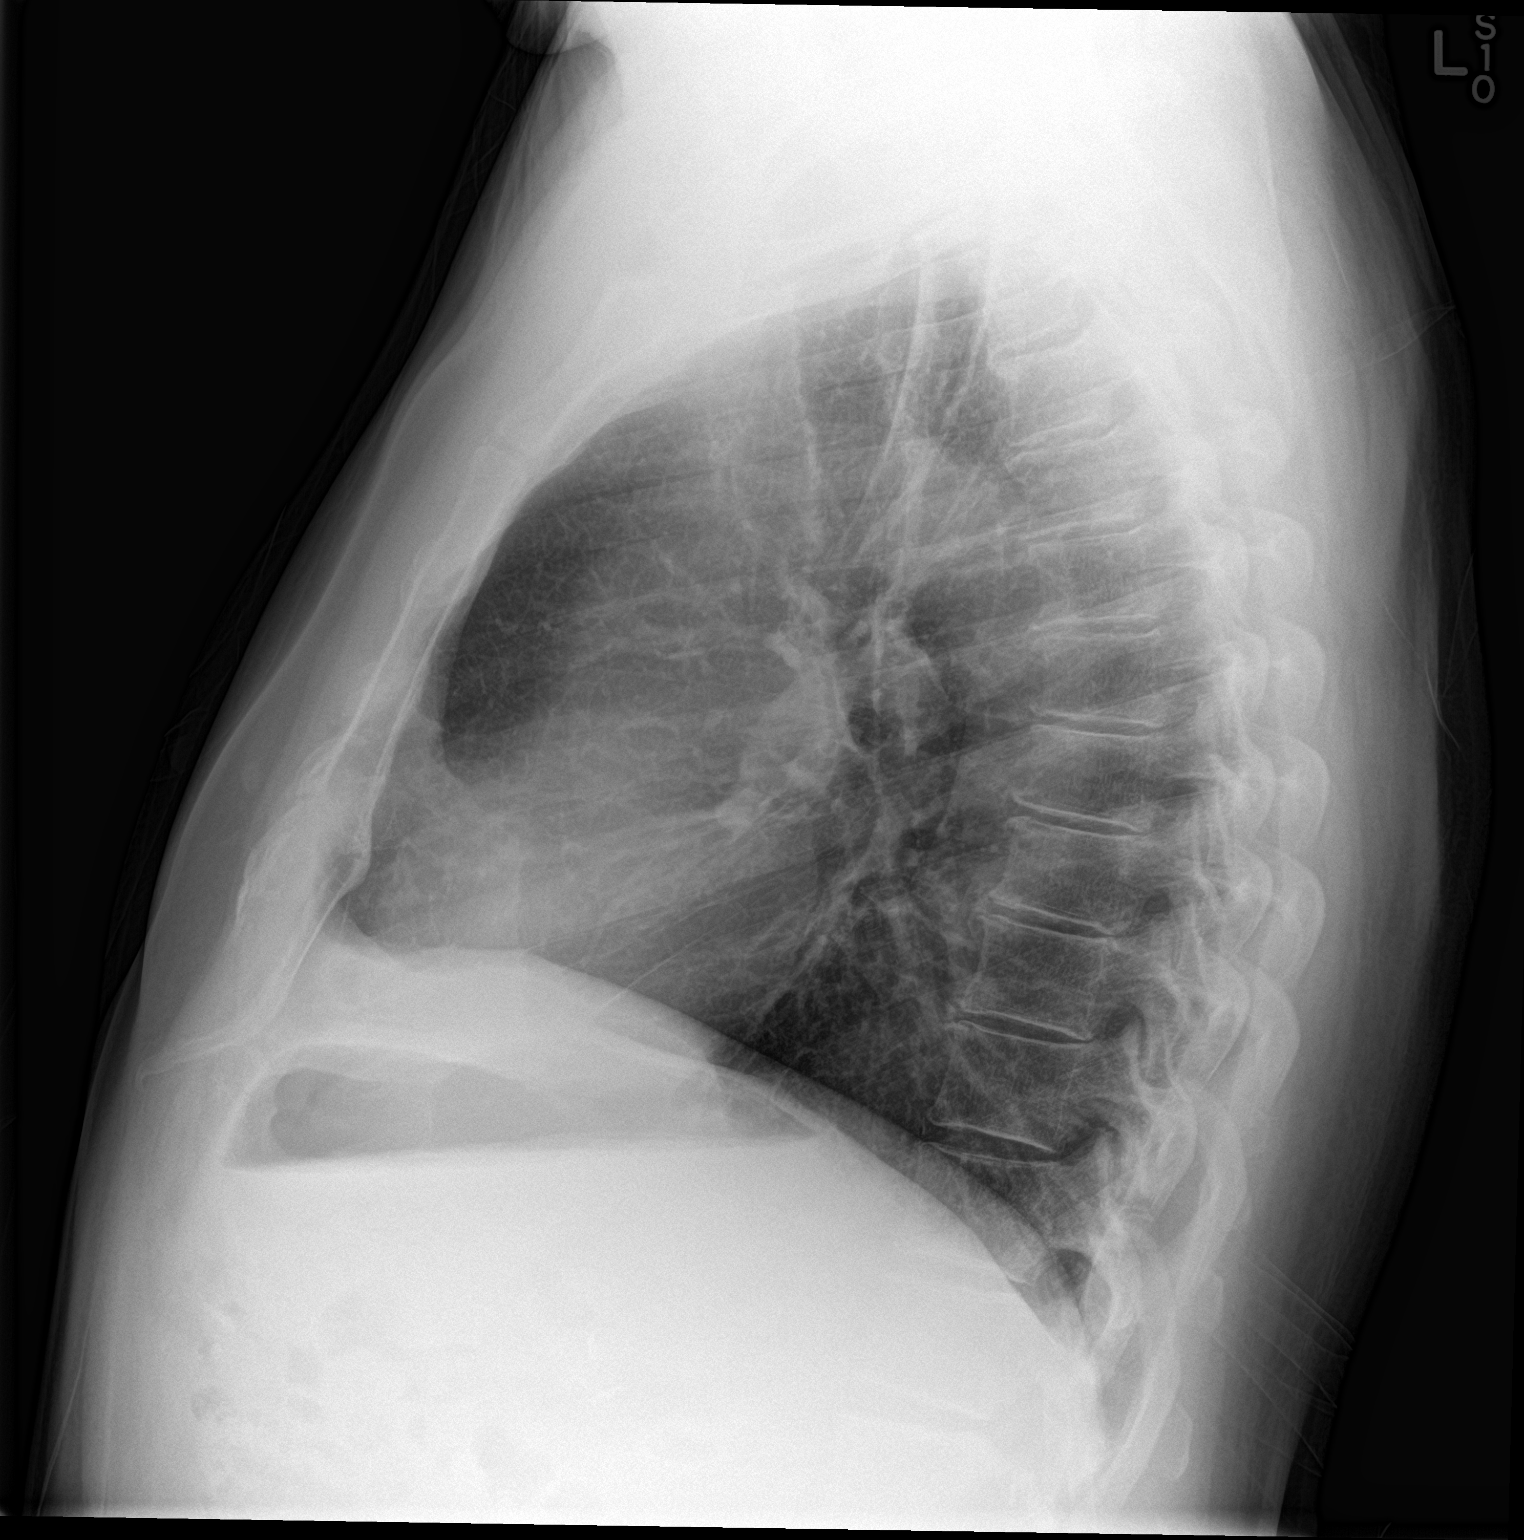

[2 of 2 positions shown; findings below may reference images not displayed]

FINDINGS: The cardiac silhouette is normal in size and configuration. The
aorta is mildly uncoiled. No mediastinal or hilar masses or evidence
of adenopathy.

Mild prominence of the interstitial lung markings at the lung bases.
Lungs otherwise clear. No pleural effusion or pneumothorax.

Skeletal structures are intact.
IMPRESSION: No active cardiopulmonary disease.
